# Patient Record
Sex: Female | Born: 1990 | Race: White | Hispanic: No | Marital: Married | Smoking: Never smoker
Health system: Southern US, Community
[De-identification: ages and names within clinical notes are randomized; demographics above are authoritative.]

## PROBLEM LIST (undated history)

## (undated) DIAGNOSIS — R51 Headache: Secondary | ICD-10-CM

## (undated) DIAGNOSIS — R519 Headache, unspecified: Secondary | ICD-10-CM

## (undated) DIAGNOSIS — Z789 Other specified health status: Secondary | ICD-10-CM

## (undated) DIAGNOSIS — Z8619 Personal history of other infectious and parasitic diseases: Secondary | ICD-10-CM

## (undated) DIAGNOSIS — F419 Anxiety disorder, unspecified: Secondary | ICD-10-CM

## (undated) DIAGNOSIS — B999 Unspecified infectious disease: Secondary | ICD-10-CM

## (undated) HISTORY — DX: Other specified health status: Z78.9

## (undated) HISTORY — PX: NO PAST SURGERIES: SHX2092

## (undated) HISTORY — PX: WISDOM TOOTH EXTRACTION: SHX21

## (undated) HISTORY — DX: Personal history of other infectious and parasitic diseases: Z86.19

## (undated) HISTORY — PX: SALPINGECTOMY: SHX328

---
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
## 2012-05-23 LAB — HM PAP SMEAR: HM Pap smear: NEGATIVE

## 2012-06-05 ENCOUNTER — Encounter: Payer: Self-pay | Admitting: Gynecology

## 2012-09-22 ENCOUNTER — Ambulatory Visit: Payer: Self-pay | Admitting: Gynecology

## 2013-12-24 ENCOUNTER — Encounter: Payer: Self-pay | Admitting: Obstetrics and Gynecology

## 2013-12-24 ENCOUNTER — Ambulatory Visit (INDEPENDENT_AMBULATORY_CARE_PROVIDER_SITE_OTHER): Payer: BC Managed Care – PPO | Admitting: Obstetrics and Gynecology

## 2013-12-24 VITALS — BP 104/60 | HR 78 | Ht 62.5 in | Wt 93.0 lb

## 2013-12-24 DIAGNOSIS — Z789 Other specified health status: Secondary | ICD-10-CM

## 2013-12-24 DIAGNOSIS — N63 Unspecified lump in unspecified breast: Secondary | ICD-10-CM

## 2013-12-24 DIAGNOSIS — Z01419 Encounter for gynecological examination (general) (routine) without abnormal findings: Secondary | ICD-10-CM

## 2013-12-24 DIAGNOSIS — N6320 Unspecified lump in the left breast, unspecified quadrant: Secondary | ICD-10-CM

## 2013-12-24 DIAGNOSIS — Z0000 Encounter for general adult medical examination without abnormal findings: Secondary | ICD-10-CM

## 2013-12-24 LAB — POCT URINALYSIS DIPSTICK
BILIRUBIN UA: NEGATIVE
GLUCOSE UA: NEGATIVE
KETONES UA: NEGATIVE
Leukocytes, UA: NEGATIVE
Nitrite, UA: NEGATIVE
Protein, UA: NEGATIVE
RBC UA: NEGATIVE
Urobilinogen, UA: NEGATIVE
pH, UA: 7

## 2013-12-24 LAB — HEMOGLOBIN, FINGERSTICK: Hemoglobin, fingerstick: 14.5 g/dL (ref 12.0–16.0)

## 2013-12-24 NOTE — Progress Notes (Addendum)
GYNECOLOGY VISIT  PCP:   Referring provider:   HPI: 23 y.o.   Married  Caucasian  female   No obstetric history on file. with Kara Landry's last menstrual period was 12/02/2013.   here for   Annual  History of left breast mass and ultrasound in 2011 in [place].  States they had difficulty finding it and that she never had follow up.   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   Wants B12 level checked.  Is a vegetarian off an on.   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  Took combined OCPs in the past and had nausea so she stopped it.  Hgb:  14.5 Urine:  Neg, ph:7.0  GYNECOLOGIC HISTORY: Kara Landry's last menstrual period was 12/02/2013. Sexually active: yes  Partner preference:female  Contraception:   condoms Menopausal hormone therapy:  none DES exposure:   none Blood transfusions: none   Sexually transmitted diseases: none    GYN procedures and prior surgeries:none   Last mammogram:  never               Last pap and high risk HPV testing: 05/23/12 Neg  Addendum - No HR HPV performed in 2014.  History of abnormal pap smear:  no   OB History   Grav Para Term Preterm Abortions TAB SAB Ect Mult Living                   LIFESTYLE: Exercise:  Walking and yoga              OTHER HEALTH MAINTENANCE: Tetanus/TDap:2008 per pt HPV: no, more info on injection Influenza:  no   Bone density: never Colonoscopy:never  Cholesterol check: never  Family History  Problem Relation Age of Onset  . Depression Other   . Osteoporosis Other   . Depression Paternal Aunt   . Heart disease Other     There are no active problems to display for this Kara Landry.  Past Medical History  Diagnosis Date  . Medical history non-contributory     Past Surgical History  Procedure Laterality Date  . No past surgeries      ALLERGIES: Review of Kara Landry's allergies indicates no known allergies.  Current Outpatient Prescriptions  Medication Sig Dispense Refill  . Pediatric  Multivit-Minerals-C (RA GUMMY VITAMINS & MINERALS) CHEW Chew by mouth.       No current facility-administered medications for this visit.     ROS:  Pertinent items are noted in HPI.  History   Social History  . Marital Status: Single    Spouse Name: N/A    Number of Children: N/A  . Years of Education: N/A   Occupational History  . Not on file.   Social History Main Topics  . Smoking status: Never Smoker   . Smokeless tobacco: Not on file  . Alcohol Use: No  . Drug Use: No  . Sexual Activity: Yes    Birth Control/ Protection: Condom   Other Topics Concern  . Not on file   Social History Narrative  . No narrative on file    PHYSICAL EXAMINATION:    BP 104/60  Pulse 78  Ht 5' 2.5" (1.588 m)  Wt 93 lb (42.185 kg)  BMI 16.73 kg/m2  LMP 12/02/2013   Wt Readings from Last 3 Encounters:  12/24/13 93 lb (42.185 kg)     Ht Readings from Last 3 Encounters:  12/24/13 5' 2.5" (1.588 m)    General appearance: alert, cooperative  and appears stated age Head: Normocephalic, without obvious abnormality, atraumatic Neck: no adenopathy, supple, symmetrical, trachea midline and thyroid not enlarged, symmetric, no tenderness/mass/nodules Lungs: clear to auscultation bilaterally Breasts: Inspection negative, No nipple retraction or dimpling, No nipple discharge or bleeding, No axillary or supraclavicular adenopathy, Normal to palpation without dominant masses on right.  Left breast with 1 cm mass in left lateral breast, nontender. Heart: regular rate and rhythm Abdomen: soft, non-tender; no masses,  no organomegaly Extremities: extremities normal, atraumatic, no cyanosis or edema Skin: Skin color, texture, turgor normal. No rashes or lesions Lymph nodes: Cervical, supraclavicular, and axillary nodes normal. No abnormal inguinal nodes palpated Neurologic: Grossly normal  Pelvic: External genitalia:  no lesions              Urethra:  normal appearing urethra with no masses,  tenderness or lesions              Bartholins and Skenes: normal                 Vagina: normal appearing vagina with normal color and discharge, no lesions              Cervix: normal appearance              Pap and high risk HPV testing done: No.        Bimanual Exam:  Uterus:  uterus is normal size, shape, consistency and nontender                                      Adnexa: normal adnexa in size, nontender and no masses                                       ASSESSMENT  Normal gynecologic exam. Left breast mass.  Vegetarian.   PLAN  Left breast ultrasound at [facility]. Mammogram recommended yearly starting at age 44. Pap smear and high risk HPV testing as above. Counseled on self breast exam, Calcium and vitamin D intake, exercise. See lab orders: Yes.   Vit B12 level.  Discussed ParaGard IUD as a long acting nonhormonal contraceptive option.  Written information to Kara Landry who will consider.  Return annually or prn   An After Visit Summary was printed and given to the Kara Landry.

## 2013-12-24 NOTE — Patient Instructions (Signed)
Intrauterine Device Information An intrauterine device (IUD) is inserted into your uterus to prevent pregnancy. There are two types of IUDs available:   Copper IUD--This type of IUD is wrapped in copper wire and is placed inside the uterus. Copper makes the uterus and fallopian tubes produce a fluid that kills sperm. The copper IUD can stay in place for 10 years.  Hormone IUD--This type of IUD contains the hormone progestin (synthetic progesterone). The hormone thickens the cervical mucus and prevents sperm from entering the uterus. It also thins the uterine lining to prevent implantation of a fertilized egg. The hormone can weaken or kill the sperm that get into the uterus. One type of hormone IUD can stay in place for 5 years, and another type can stay in place for 3 years. Your health care provider will make sure you are a good candidate for a contraceptive IUD. Discuss with your health care provider the possible side effects.  ADVANTAGES OF AN INTRAUTERINE DEVICE  IUDs are highly effective, reversible, long acting, and low maintenance.   There are no estrogen-related side effects.   An IUD can be used when breastfeeding.   IUDs are not associated with weight gain.   The copper IUD works immediately after insertion.   The hormone IUD works right away if inserted within 7 days of your period starting. You will need to use a backup method of birth control for 7 days if the hormone IUD is inserted at any other time in your cycle.  The copper IUD does not interfere with your female hormones.   The hormone IUD can make heavy menstrual periods lighter and decrease cramping.   The hormone IUD can be used for 3 or 5 years.   The copper IUD can be used for 10 years. DISADVANTAGES OF AN INTRAUTERINE DEVICE  The hormone IUD can be associated with irregular bleeding patterns.   The copper IUD can make your menstrual flow heavier and more painful.   You may experience cramping and  vaginal bleeding after insertion.  Document Released: 02/21/2004 Document Revised: 11/19/2012 Document Reviewed: 09/07/2012 ExitCare Patient Information 2015 [employer]. This information is not intended to replace advice given to you by your health care provider. Make sure you discuss any questions you have with your health care provider.  

## 2013-12-24 NOTE — Progress Notes (Signed)
Patient is scheduled for L Breast Ultrasound at [facility] for 01/14/14 at 1300 (patient choice of date). Records requested from [facility] to [facility] [place]. Patient signed release.

## 2013-12-25 LAB — VITAMIN B12: VITAMIN B 12: 643 pg/mL (ref 211–911)

## 2014-01-14 ENCOUNTER — Ambulatory Visit
Admission: RE | Admit: 2014-01-14 | Discharge: 2014-01-14 | Disposition: A | Discharge status: Home / Self Care | Payer: BC Managed Care – PPO | Source: Ambulatory Visit | Attending: Obstetrics and Gynecology | Admitting: Obstetrics and Gynecology

## 2014-01-14 DIAGNOSIS — N6320 Unspecified lump in the left breast, unspecified quadrant: Secondary | ICD-10-CM

## 2014-06-21 ENCOUNTER — Other Ambulatory Visit: Payer: Self-pay | Admitting: Obstetrics and Gynecology

## 2014-06-21 DIAGNOSIS — N6320 Unspecified lump in the left breast, unspecified quadrant: Secondary | ICD-10-CM

## 2014-07-16 ENCOUNTER — Ambulatory Visit
Admission: RE | Admit: 2014-07-16 | Discharge: 2014-07-16 | Disposition: A | Discharge status: Home / Self Care | Payer: Self-pay | Source: Ambulatory Visit | Attending: Obstetrics and Gynecology | Admitting: Obstetrics and Gynecology

## 2014-07-16 DIAGNOSIS — N6320 Unspecified lump in the left breast, unspecified quadrant: Secondary | ICD-10-CM

## 2014-10-08 ENCOUNTER — Telehealth: Payer: Self-pay | Admitting: Obstetrics and Gynecology

## 2014-10-08 NOTE — Telephone Encounter (Signed)
Patient says she is having some problems she would like to speak with nurse about.

## 2014-10-08 NOTE — Telephone Encounter (Signed)
Spoke with patient. Patient states that she has been seeing a counselor for depression. Counselor feels symptoms may be related to patients menses. States she has symptoms of PMDD. Patient is requesting an appointment for the week of 7/18 as she will be out of town next week. Appointment scheduled for 10/18/2014 at 2:30pm with Dr.Silva. Patient is agreeable to date and time.  Routing to provider for final review. Patient agreeable to disposition. Will close encounter.   Patient aware provider will review message and nurse will return call if any additional advice or change of disposition.

## 2014-10-18 ENCOUNTER — Ambulatory Visit: Payer: Self-pay | Admitting: Obstetrics and Gynecology

## 2014-11-03 ENCOUNTER — Encounter: Payer: Self-pay | Admitting: Obstetrics and Gynecology

## 2014-11-03 ENCOUNTER — Ambulatory Visit (INDEPENDENT_AMBULATORY_CARE_PROVIDER_SITE_OTHER): Payer: 59 | Admitting: Obstetrics and Gynecology

## 2014-11-03 VITALS — BP 120/76 | HR 100 | Ht 62.5 in | Wt 93.2 lb

## 2014-11-03 DIAGNOSIS — N943 Premenstrual tension syndrome: Secondary | ICD-10-CM

## 2014-11-03 DIAGNOSIS — F3281 Premenstrual dysphoric disorder: Secondary | ICD-10-CM

## 2014-11-03 NOTE — Progress Notes (Signed)
Patient ID: Kara Landry, female   DOB: [birth year], 24 y.o.   MRN: [id number] GYNECOLOGY  VISIT   HPI: 24 y.o.   Married  Caucasian  female   No obstetric history on file. with Patient's last menstrual period was 10/31/2014 (exact date).   here for consultation for depression/anxiety the last 2 weeks of the month.  She states she has been seeing a Social worker and she feels its cycle related and would like Dr. Elza Rafter recommendations.  Anxiety is getting under control.   Has underlying depression. Depression is increased and severe to the point of suicidal thoughts starting 2 weeks prior to her menses.  Lasts until 1 - 2 days after her cycle starts.  Goes to the [place], who is aware of patient's depression and suggested a visit to GYN office. She has phone numbers for psychiatrists to discuss medical therapy but has not made an appointment yet. Wanted to come here to discuss PMDD first.  Considering an IUD to help PMDD.  Menses are normal every 28 days.  Menses are painful and has cramping prior to cycles starting.  Flow is heavy first day or two but no excessive bleeding.   Does not tolerate combined OCPs well so does not take them. Nausea.  Has phobia of vomiting.   Is due for left breast ultrasound in October 2016.   Did exams for teaching.  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   GYNECOLOGIC HISTORY: Patient's last menstrual period was 10/31/2014 (exact date). Contraception:condoms Menopausal hormone therapy: n/a Last mammogram: Left Br.US done 01-14-14 for palpable mass--Oval circumscribed hypoechoic mass within the left breast mayrepresent a small fibroadenoma versus complicated cyst, probably benign. Follow-up ultrasound is recommended at 6, 12,and 24 months to assess stability. The patient concurs with this plan. Left Br. [place] 07-16-14 showing slightly enlarging likely benign mass in the upper-outer left breast.  Probably represents a  fibroadenoma.  Options of six-month followup and tissue sampling were discussed.  The patient desired six-month followup.  Last pap smear: 05-23-12 Neg:No HR HPV testing done        OB History    No data available         Patient Active Problem List   Diagnosis Date Noted  . Left breast mass 12/24/2013    Past Medical History  Diagnosis Date  . Medical history non-contributory     Past Surgical History  Procedure Laterality Date  . No past surgeries      Current Outpatient Prescriptions  Medication Sig Dispense Refill  . Pediatric Multivit-Minerals-C (RA GUMMY VITAMINS & MINERALS) CHEW Chew by mouth.     No current facility-administered medications for this visit.     ALLERGIES: Review of patient's allergies indicates no known allergies.  Family History  Problem Relation Age of Onset  . Depression Other   . Osteoporosis Other   . Breast cancer Other     paternal great aunt - breast cancer  . Depression Paternal Aunt   . Cancer Paternal Aunt 4    paternal aunt - uterine cancer  . Heart disease Other   . Cancer Maternal Aunt 78    maternal aunt - ovarian cancer    History   Social History  . Marital Status: Married    Spouse Name: N/A  . Number of Children: N/A  . Years of Education: N/A   Occupational History  . Not on file.   Social History Main Topics  . Smoking status:  Never Smoker   . Smokeless tobacco: Not on file  . Alcohol Use: No  . Drug Use: No  . Sexual Activity: Yes    Birth Control/ Protection: Condom   Other Topics Concern  . Not on file   Social History Narrative    ROS:  Pertinent items are noted in HPI.  PHYSICAL EXAMINATION:    BP 120/76 mmHg  Pulse 100  Ht 5' 2.5" (1.588 m)  Wt 93 lb 3.2 oz (42.275 kg)  BMI 16.76 kg/m2  LMP 10/31/2014 (Exact Date)    General appearance: alert, cooperative and appears stated age   ASSESSMENT  Depression and PMDD. Hx suicidal thoughts.  Phobia of vomiting.  Intolerance to combined  oral contraceptives - nausea.   PLAN  Counseled regarding PMDD.   I discuss Skyla IUD - risks and benefits.  I am recommending that patient pursue consultation with psychiatry office for medical therapy for depression and PMDD.  Isla Pence can be an adjunct to to therapy, but will not be adequate alone for her care.  Return for annual exam in 2 months.  Call for any assistance needed.  An After Visit Summary was printed and given to the patient.  ____25__ minutes face to face time of which over 50% was spent in counseling.

## 2014-12-30 ENCOUNTER — Ambulatory Visit: Payer: Self-pay | Admitting: Certified Nurse Midwife

## 2015-01-07 ENCOUNTER — Ambulatory Visit: Payer: Self-pay | Admitting: Obstetrics and Gynecology

## 2015-01-18 ENCOUNTER — Other Ambulatory Visit: Payer: Self-pay | Admitting: Obstetrics and Gynecology

## 2015-01-18 DIAGNOSIS — N6320 Unspecified lump in the left breast, unspecified quadrant: Secondary | ICD-10-CM

## 2015-01-25 ENCOUNTER — Ambulatory Visit
Admission: RE | Admit: 2015-01-25 | Discharge: 2015-01-25 | Disposition: A | Discharge status: Home / Self Care | Payer: 59 | Source: Ambulatory Visit | Attending: Obstetrics and Gynecology | Admitting: Obstetrics and Gynecology

## 2015-01-25 DIAGNOSIS — N6320 Unspecified lump in the left breast, unspecified quadrant: Secondary | ICD-10-CM

## 2015-03-11 ENCOUNTER — Telehealth: Payer: Self-pay | Admitting: Obstetrics and Gynecology

## 2015-03-11 NOTE — Telephone Encounter (Signed)
LMTCB about canceled appointment with Dr. Nelson Chimes.

## 2015-03-16 ENCOUNTER — Ambulatory Visit: Payer: Self-pay | Admitting: Obstetrics and Gynecology

## 2015-03-17 ENCOUNTER — Encounter: Payer: Self-pay | Admitting: Obstetrics and Gynecology

## 2015-03-17 ENCOUNTER — Ambulatory Visit (INDEPENDENT_AMBULATORY_CARE_PROVIDER_SITE_OTHER): Payer: 59 | Admitting: Obstetrics and Gynecology

## 2015-03-17 VITALS — BP 106/56 | HR 110 | Resp 16 | Ht 62.5 in | Wt 94.0 lb

## 2015-03-17 DIAGNOSIS — R636 Underweight: Secondary | ICD-10-CM | POA: Diagnosis not present

## 2015-03-17 DIAGNOSIS — Z0000 Encounter for general adult medical examination without abnormal findings: Secondary | ICD-10-CM | POA: Diagnosis not present

## 2015-03-17 DIAGNOSIS — Z01419 Encounter for gynecological examination (general) (routine) without abnormal findings: Secondary | ICD-10-CM

## 2015-03-17 LAB — COMPREHENSIVE METABOLIC PANEL
ALK PHOS: 40 U/L (ref 33–115)
ALT: 14 U/L (ref 6–29)
AST: 16 U/L (ref 10–30)
Albumin: 4.7 g/dL (ref 3.6–5.1)
BILIRUBIN TOTAL: 0.5 mg/dL (ref 0.2–1.2)
BUN: 11 mg/dL (ref 7–25)
CO2: 28 mmol/L (ref 20–31)
CREATININE: 0.68 mg/dL (ref 0.50–1.10)
Calcium: 9.1 mg/dL (ref 8.6–10.2)
Chloride: 104 mmol/L (ref 98–110)
Glucose, Bld: 83 mg/dL (ref 65–99)
Potassium: 4.8 mmol/L (ref 3.5–5.3)
SODIUM: 141 mmol/L (ref 135–146)
TOTAL PROTEIN: 7.3 g/dL (ref 6.1–8.1)

## 2015-03-17 LAB — CBC
HCT: 40.7 % (ref 36.0–46.0)
Hemoglobin: 13.8 g/dL (ref 12.0–15.0)
MCH: 30.7 pg (ref 26.0–34.0)
MCHC: 33.9 g/dL (ref 30.0–36.0)
MCV: 90.6 fL (ref 78.0–100.0)
MPV: 9.9 fL (ref 8.6–12.4)
PLATELETS: 329 10*3/uL (ref 150–400)
RBC: 4.49 MIL/uL (ref 3.87–5.11)
RDW: 12.6 % (ref 11.5–15.5)
WBC: 9.2 10*3/uL (ref 4.0–10.5)

## 2015-03-17 LAB — TSH: TSH: 1.34 u[IU]/mL (ref 0.350–4.500)

## 2015-03-17 NOTE — Progress Notes (Signed)
Patient ID: Kara Landry, female   DOB: 1991/01/20, 24 y.o.   MRN: [id number] 24 y.o. No obstetric history on file. MarriedCaucasianF here for annual exam.  The patient has a stable left breast fibroadenoma. Due for a f/u u/s in 01/2016. The patient has a h/o PMDD, she is working on it, she is meeting with a Psychiatrist to discuss medication. She has issues with anxiety, can effect her ability to eat. Not trying to loose weight. She has been trying to eat more.  Period Cycle (Days): 28 Period Duration (Days): 4-5 days  Period Pattern: Regular Menstrual Flow: Moderate Menstrual Control: Maxi pad, Thin pad Dysmenorrhea Symptoms: Cramping  Only needs 2 pads a day.   Patient's last menstrual period was 02/20/2015.          Sexually active: Yes.    The current method of family planning is condoms all the time.    Exercising: Yes.    walking and yoga Smoker:  no  Health Maintenance: Pap:  05-23-12 WNL  History of abnormal Pap:  no MMG:  Left Breast U/S Stable probable fibroadenoma in the upper outer left breast. Twelve month followup is recommended to ensure stability. Colonoscopy:  Never BMD:   Never TDaP:  07/2006 Gardasil: no    reports that she has never smoked. She has never used smokeless tobacco. 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 Husband is a Insurance underwriter.   Past Medical History  Diagnosis Date  . Medical history non-contributory     Past Surgical History  Procedure Laterality Date  . No past surgeries      Current Outpatient Prescriptions  Medication Sig Dispense Refill  . Multiple Vitamin (MULTIVITAMIN) tablet Take 1 tablet by mouth daily.     No current facility-administered medications for this visit.    Family History  Problem Relation Age of Onset  . Depression Other   . Osteoporosis Other   . Breast cancer Other     paternal great aunt - breast cancer  . Depression  Paternal Aunt   . Cancer Paternal Aunt 93    paternal aunt - uterine cancer  . Heart disease Other   . Cancer Maternal Aunt 5    maternal aunt - ovarian cancer  . Osteoporosis Mother     Review of Systems  Constitutional: Negative.   HENT: Negative.   Eyes: Negative.   Respiratory: Negative.   Cardiovascular: Negative.   Gastrointestinal: Negative.   Endocrine: Negative.   Genitourinary: Negative.   Musculoskeletal: Negative.   Skin: Negative.   Allergic/Immunologic: Negative.   Neurological: Negative.   Psychiatric/Behavioral: Negative.     Exam:   BP 106/56 mmHg  Pulse 110  Resp 16  Ht 5' 2.5" (1.588 m)  Wt 94 lb (42.638 kg)  BMI 16.91 kg/m2  LMP 02/20/2015  Weight change: @WEIGHTCHANGE @ Height:   Height: 5' 2.5" (158.8 cm)  Ht Readings from Last 3 Encounters:  03/17/15 5' 2.5" (1.588 m)  11/03/14 5' 2.5" (1.588 m)  12/24/13 5' 2.5" (1.588 m)    General appearance: alert, cooperative and appears stated age Head: Normocephalic, without obvious abnormality, atraumatic Neck: no adenopathy, supple, symmetrical, trachea midline and thyroid normal to inspection and palpation Lungs: clear to auscultation bilaterally Breasts: left breast with an approximately 1 cm, irregularly shaped, mobile mass at 2 o'clock. No lumps in the right breast. No dimpling or skin changes Heart: regular rate and rhythm  Abdomen: soft, non-tender; bowel sounds normal; no masses,  no organomegaly Extremities: extremities normal, atraumatic, no cyanosis or edema Skin: Skin color, texture, turgor normal. No rashes or lesions Lymph nodes: Cervical, supraclavicular, and axillary nodes normal. No abnormal inguinal nodes palpated Neurologic: Grossly normal   Pelvic: External genitalia:  no lesions              Urethra:  normal appearing urethra with no masses, tenderness or lesions              Bartholins and Skenes: normal                 Vagina: normal appearing vagina with normal color and  discharge, no lesions              Cervix: no lesions               Bimanual Exam:  Uterus:  normal size, contour, position, consistency, mobility, non-tender              Adnexa: no mass, fullness, tenderness               Rectovaginal: Confirms               Anus:  normal sphincter tone, no lesions  Chaperone was present for exam.  A:  Well Woman with normal exam  PMDD  Anxiety  Underweight (not intentional)  Left breast fibroadenoma, stable  P:   No pap this year  She is seeing a Teacher, music and plans to start medication for PMDD and anxiety  TSH, CBC, CMP  No pap this year  She will continue with condoms and NFP for contraception  Continue monthly breast self exams  F/U breast ultrasound in 10/17

## 2015-03-17 NOTE — Patient Instructions (Signed)
EXERCISE AND DIET:  We recommended that you start or continue a regular exercise program for good health. Regular exercise means any activity that makes your heart beat faster and makes you sweat.  We recommend exercising at least 30 minutes per day at least 3 days a week, preferably 4 or 5.  We also recommend a diet low in fat and sugar.  Inactivity, poor dietary choices and obesity can cause diabetes, heart attack, stroke, and kidney damage, among others.    ALCOHOL AND SMOKING:  Women should limit their alcohol intake to no more than 7 drinks/beers/glasses of wine (combined, not each!) per week. Moderation of alcohol intake to this level decreases your risk of breast cancer and liver damage. And of course, no recreational drugs are part of a healthy lifestyle.  And absolutely no smoking or even second hand smoke. Most people know smoking can cause heart and lung diseases, but did you know it also contributes to weakening of your bones? Aging of your skin?  Yellowing of your teeth and nails?  CALCIUM AND VITAMIN D:  Adequate intake of calcium and Vitamin D are recommended.  The recommendations for exact amounts of these supplements seem to change often, but generally speaking 600 mg of calcium (either carbonate or citrate) and 800 units of Vitamin D per day seems prudent. Certain women may benefit from higher intake of Vitamin D.  If you are among these women, your doctor will have told you during your visit.    PAP SMEARS:  Pap smears, to check for cervical cancer or precancers,  have traditionally been done yearly, although recent scientific advances have shown that most women can have pap smears less often.  However, every woman still should have a physical exam from her gynecologist every year. It will include a breast check, inspection of the vulva and vagina to check for abnormal growths or skin changes, a visual exam of the cervix, and then an exam to evaluate the size and shape of the uterus and  ovaries.  And after 26 years of age, a rectal exam is indicated to check for rectal cancers. We will also provide age appropriate advice regarding health maintenance, like when you should have certain vaccines, screening for sexually transmitted diseases, bone density testing, colonoscopy, mammograms, etc.   MAMMOGRAMS:  All women over 40 years old should have a yearly mammogram. Many facilities now offer a "3D" mammogram, which may cost around $50 extra out of pocket. If possible,  we recommend you accept the option to have the 3D mammogram performed.  It both reduces the number of women who will be called back for extra views which then turn out to be normal, and it is better than the routine mammogram at detecting truly abnormal areas.    COLONOSCOPY:  Colonoscopy to screen for colon cancer is recommended for all women at age 50.  We know, you hate the idea of the prep.  We agree, BUT, having colon cancer and not knowing it is worse!!  Colon cancer so often starts as a polyp that can be seen and removed at colonscopy, which can quite literally save your life!  And if your first colonoscopy is normal and you have no family history of colon cancer, most women don't have to have it again for 10 years.  Once every ten years, you can do something that may end up saving your life, right?  We will be happy to help you get it scheduled when you are ready.    Be sure to check your insurance coverage so you understand how much it will cost.  It may be covered as a preventative service at no cost, but you should check your particular policy.     Premenstrual Syndrome Premenstrual syndrome (PMS) is a condition that consists of physical, emotional, and behavioral symptoms that affect women of childbearing age. PMS occurs 5-14 days before the start of a menstrual period and often recurs in a predictable pattern. The symptoms go away a few days after the menstrual period starts. PMS can interfere in many ways with normal  daily activities and can range from mild to severe. When PMS is considered severe, it may be diagnosed as premenstrual dysphoric disorder (PMDD). A small percentage of women are affected by PMS symptoms and an even smaller percentage of those women are affected by PMDD.  CAUSES  The exact cause of PMS is unknown, but it seems to be related to cyclic hormone changes that happen before menstruation. These hormones are thought to affect chemicals in the brain (serotonin) that can influence a person's mood.  SYMPTOMS  Symptoms of PMS recur consistently from month to month and go away completely after the menstrual period starts. The most common emotional or behavioral symptom is mood swings. These mood swings can be disabling and interfere with normal activities of daily living. Other common symptoms include depression and angry outbursts. Other symptoms may include:   Irritability.  Anxiety.  Crying spells.   Food cravings or appetite changes.   Changes in sexual desire.   Confusion.   Aggression.   Social withdrawal.   Poor concentration. The most common physical symptoms include a sense of bloating, breast pain, headaches, and extreme fatigue. Other physical symptoms include:   Backaches.   Swelling of the hands and feet.   Weight gain.   Hot flashes.  DIAGNOSIS  To make a diagnosis, your caregiver will ask questions to confirm that you are having a pattern of symptoms. Symptoms must:   Be present 5 days before the start of your period and be present at least 3 months in a row.   End within 4 days after your period starts.   Interfere with some of your normal activities.  Other conditions, such as thyroid disease, depression, and migraine headaches must be ruled out before a diagnosis of PMS is confirmed.  TREATMENT  Your caregiver may suggest ways to maintain a healthy lifestyle, such as exercise. Over-the-counter pain relievers may ease cramps, aches, pains,  headaches, and breast tenderness. However, selective serotonin reuptake inhibitors (SSRIs) are medicines that are most beneficial in improving PMS if taken in the second half of the monthly cycle. They may be taken on a daily basis. The most effective oral contraceptive pill used for symptoms of PMS is one that contains the ingredient drospirenone. Taking 4 days off of the pill instead of the usual 7 days also has shown to increase effectiveness.  There are a number of drugs, dietary supplements, vitamins, and water pills (diuretics) which have been suggested to be helpful but have not shown to be of any benefit to improving PMS symptoms.  HOME CARE INSTRUCTIONS   For 2-3 months, write down your symptoms, their severity, and how long they last. This may help your caregiver prescribe the best treatment for your symptoms.  Exercise regularly as suggested by your caregiver.  Eat a regular, well-balanced diet.  Avoid caffeine, alcohol, and tobacco consumption.  Limit salt and salty foods to lessen bloating and fluid  retention.  Get enough sleep. Practice relaxation techniques.  Drink enough fluids to keep your urine clear or pale yellow.  Take medicines as directed by your caregiver.  Limit stress.  Take a multivitamin as directed by your caregiver.   This information is not intended to replace advice given to you by your health care provider. Make sure you discuss any questions you have with your health care provider.   Document Released: 03/16/2000 Document Revised: 12/12/2011 Document Reviewed: 08/06/2011 Elsevier Interactive Patient Education [employer].

## 2015-07-05 ENCOUNTER — Encounter (HOSPITAL_COMMUNITY): Payer: Self-pay

## 2015-07-05 ENCOUNTER — Emergency Department (HOSPITAL_COMMUNITY): Payer: 59

## 2015-07-05 ENCOUNTER — Emergency Department (HOSPITAL_COMMUNITY)
Admission: EM | Admit: 2015-07-05 | Discharge: 2015-07-05 | Disposition: A | Discharge status: Home / Self Care | Payer: 59 | Attending: Emergency Medicine | Admitting: Emergency Medicine

## 2015-07-05 DIAGNOSIS — R000 Tachycardia, unspecified: Secondary | ICD-10-CM | POA: Diagnosis not present

## 2015-07-05 DIAGNOSIS — Z79899 Other long term (current) drug therapy: Secondary | ICD-10-CM | POA: Diagnosis not present

## 2015-07-05 DIAGNOSIS — Y92410 Unspecified street and highway as the place of occurrence of the external cause: Secondary | ICD-10-CM | POA: Diagnosis not present

## 2015-07-05 DIAGNOSIS — F419 Anxiety disorder, unspecified: Secondary | ICD-10-CM | POA: Insufficient documentation

## 2015-07-05 DIAGNOSIS — Y998 Other external cause status: Secondary | ICD-10-CM | POA: Insufficient documentation

## 2015-07-05 DIAGNOSIS — R0789 Other chest pain: Secondary | ICD-10-CM

## 2015-07-05 DIAGNOSIS — S29001A Unspecified injury of muscle and tendon of front wall of thorax, initial encounter: Secondary | ICD-10-CM | POA: Diagnosis not present

## 2015-07-05 DIAGNOSIS — Y9389 Activity, other specified: Secondary | ICD-10-CM | POA: Diagnosis not present

## 2015-07-05 DIAGNOSIS — S30811A Abrasion of abdominal wall, initial encounter: Secondary | ICD-10-CM | POA: Diagnosis not present

## 2015-07-05 DIAGNOSIS — S299XXA Unspecified injury of thorax, initial encounter: Secondary | ICD-10-CM | POA: Diagnosis present

## 2015-07-05 HISTORY — DX: Anxiety disorder, unspecified: F41.9

## 2015-07-05 MED ORDER — DIAZEPAM 2 MG PO TABS: 2.0000 mg | ORAL_TABLET | Freq: Three times a day (TID) | ORAL | Status: DC

## 2015-07-05 MED ORDER — IBUPROFEN 800 MG PO TABS: 800.0000 mg | ORAL_TABLET | Freq: Three times a day (TID) | ORAL | Status: AC

## 2015-07-05 MED ORDER — KETOROLAC TROMETHAMINE 30 MG/ML IJ SOLN
30.0000 mg | Freq: Once | INTRAMUSCULAR | Status: AC
  Administered 2015-07-05: 30 mg via INTRAMUSCULAR
  Filled 2015-07-05: qty 1

## 2015-07-05 NOTE — ED Notes (Signed)
Patient transported to X-ray 
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
## 2015-07-08 ENCOUNTER — Encounter: Payer: Self-pay | Admitting: Physician Assistant

## 2015-07-08 ENCOUNTER — Ambulatory Visit (INDEPENDENT_AMBULATORY_CARE_PROVIDER_SITE_OTHER): Payer: 59 | Admitting: Physician Assistant

## 2015-07-08 ENCOUNTER — Ambulatory Visit (HOSPITAL_BASED_OUTPATIENT_CLINIC_OR_DEPARTMENT_OTHER)
Admission: RE | Admit: 2015-07-08 | Discharge: 2015-07-08 | Disposition: A | Discharge status: Home / Self Care | Payer: 59 | Source: Ambulatory Visit | Attending: Physician Assistant | Admitting: Physician Assistant

## 2015-07-08 DIAGNOSIS — F411 Generalized anxiety disorder: Secondary | ICD-10-CM | POA: Diagnosis not present

## 2015-07-08 DIAGNOSIS — M542 Cervicalgia: Secondary | ICD-10-CM

## 2015-07-08 MED ORDER — HYDROCODONE-ACETAMINOPHEN 5-325 MG PO TABS: 1.0000 | ORAL_TABLET | Freq: Four times a day (QID) | ORAL | Status: DC | PRN
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
## 2015-07-08 NOTE — Assessment & Plan Note (Signed)
Followed by Psychiatry. Doing very well on BuSpar. Will continue current regimen. FU with specialist as scheduled.

## 2015-07-08 NOTE — Patient Instructions (Signed)
Please go downstairs for x-ray. I will call you with your results. Please use the Norco as directed for severe pain. Continue wearing collar until I tell you otherwise.  Use the Valium as directed. No driving while on this medication. Continue warm compresses. Add on a topical Aspercreme.  Follow-up with me next week (Tuesday or Wednesday) If I see anything worrisome on x-ray I will be sending you to see a specialist.  Welcome to [facility]!

## 2015-07-08 NOTE — Progress Notes (Signed)
Pre visit review using our clinic review tool, if applicable. No additional management support is needed unless otherwise documented below in the visit note. 
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
## 2015-07-13 ENCOUNTER — Ambulatory Visit (INDEPENDENT_AMBULATORY_CARE_PROVIDER_SITE_OTHER): Payer: 59 | Admitting: Physician Assistant

## 2015-07-13 ENCOUNTER — Encounter: Payer: Self-pay | Admitting: Physician Assistant

## 2015-07-13 VITALS — BP 110/66 | HR 110 | Temp 98.0°F | Resp 16 | Ht 62.5 in | Wt 93.4 lb

## 2015-07-13 DIAGNOSIS — M542 Cervicalgia: Secondary | ICD-10-CM | POA: Diagnosis not present

## 2015-07-13 NOTE — Patient Instructions (Signed)
I am glad symptoms are improving. Continue medications and stretches as directed. Stay hydrated and hold a pillow to chest if you need to cough or to help with deep breaths.  You can transition to tylenol or ibuprofen as pain improves.   Follow-up if symptoms are not continuing to resolve.

## 2015-07-15 DIAGNOSIS — M542 Cervicalgia: Secondary | ICD-10-CM | POA: Insufficient documentation
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
## 2015-09-21 ENCOUNTER — Telehealth: Payer: Self-pay | Admitting: *Deleted

## 2015-09-21 NOTE — Telephone Encounter (Signed)
OV notes from 07/08/15 and 07/13/15 with [facility] printed and faxed to [phone] successfully.  Noted on fax cover sheet that records from pt's ED visit at [facility] needs to be faxed to [phone].

## 2015-10-17 ENCOUNTER — Encounter: Payer: Self-pay | Admitting: Physician Assistant

## 2015-10-17 ENCOUNTER — Ambulatory Visit (INDEPENDENT_AMBULATORY_CARE_PROVIDER_SITE_OTHER): Payer: 59 | Admitting: Physician Assistant

## 2015-10-17 VITALS — BP 114/82 | HR 84 | Temp 98.4°F | Resp 16 | Ht 62.5 in | Wt 90.5 lb

## 2015-10-17 DIAGNOSIS — Z111 Encounter for screening for respiratory tuberculosis: Secondary | ICD-10-CM

## 2015-10-17 DIAGNOSIS — Z021 Encounter for pre-employment examination: Secondary | ICD-10-CM | POA: Diagnosis not present

## 2015-10-17 NOTE — Progress Notes (Signed)
Patient presents to clinic today to review forms for new job. 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 Needs to verify she is up-to-date on immunizations, need medical clearance and PPD testing. Denies acute concerns today. Has immunization records with her for review. Denies foreign travel, cough, hemoptysis or history of TB or TB exposure. Denies difficulty with hearing. Wears corrective lenses. Denies history of mental illness. Denies current anxiety/depression. Is excited about new job. Patient denies any prior issue that would limit ability to be physically active.  Past Medical History  Diagnosis Date  . Medical history non-contributory   . Anxiety   . History of chicken pox 1996    Current Outpatient Prescriptions on File Prior to Visit  Medication Sig Dispense Refill  . busPIRone (BUSPAR) 5 MG tablet Take 5 mg by mouth 2 (two) times daily.    Marland Kitchen ibuprofen (ADVIL,MOTRIN) 200 MG tablet Take 400 mg by mouth every 6 (six) hours as needed.    . Multiple Vitamins-Minerals (ADULT GUMMY) CHEW Chew 2 capsules by mouth daily.     No current facility-administered medications on file prior to visit.    No Known Allergies  Family History  Problem Relation Age of Onset  . Depression Other   . Osteoporosis Other   . Breast cancer Other     paternal great aunt - breast cancer  . Depression Paternal Aunt   . Cancer Paternal Aunt 87    paternal aunt - uterine cancer  . Heart disease Other   . Cancer Maternal Aunt 37    maternal aunt - ovarian cancer  . Osteoporosis Mother     Social History   Social History  . Marital Status: Married    Spouse Name: N/A  . Number of Children: 0  . Years of Education: N/A   Occupational History  . Student     elementary education   Social History Main Topics  . Smoking status: Never Smoker   . Smokeless tobacco: Never Used  . Alcohol Use: No  . Drug Use: No  . Sexual Activity:    Partners: Male    Birth  Control/ Protection: Condom   Other Topics Concern  . None   Social History Narrative    Review of Systems - See HPI.  All other ROS are negative.  BP 114/82 mmHg  Pulse 84  Temp(Src) 98.4 F (36.9 C) (Oral)  Resp 16  Ht 5' 2.5" (1.588 m)  Wt 90 lb 8 oz (41.051 kg)  BMI 16.28 kg/m2  SpO2 99%  LMP 10/17/2015  Physical Exam  Constitutional: She is oriented to person, place, and time and well-developed, well-nourished, and in no distress.  HENT:  Head: Normocephalic and atraumatic.  Eyes: Conjunctivae are normal.  Neck: Neck supple. No thyromegaly present.  Cardiovascular: Normal rate, regular rhythm, normal heart sounds and intact distal pulses.   Pulmonary/Chest: Effort normal and breath sounds normal. No respiratory distress. She has no wheezes. She has no rales. She exhibits no tenderness.  Musculoskeletal: Normal range of motion. She exhibits no edema or tenderness.  Neurological: She is alert and oriented to person, place, and time.  Skin: Skin is warm and dry. No rash noted.  Psychiatric: Affect normal.  Vitals reviewed.  Assessment/Plan: 1. Screening for tuberculosis PPD placed by nursing staff. She will return Wednesday for read.  - PPD  2. Pre-employment examination No concerning findings. Immunizations up-to-date. Will abstract into the EMR. Forms have been completed. Will  sign pending PPD test result.   Leeanne Rio, PA-C

## 2015-10-17 NOTE — Patient Instructions (Signed)
Everything looks good today.  Please return between 11:00 Wednesday and 11:00 Thursday to have test read.   I will give you your signed forms once we have the result!  Good luck at your new job.

## 2015-10-17 NOTE — Progress Notes (Signed)
Pre visit review using our clinic review tool, if applicable. No additional management support is needed unless otherwise documented below in the visit note/SLS  

## 2015-10-19 LAB — TB SKIN TEST
INDURATION: 0 mm
TB Skin Test: NEGATIVE

## 2015-10-27 ENCOUNTER — Telehealth: Payer: Self-pay | Admitting: *Deleted

## 2015-10-27 NOTE — Telephone Encounter (Signed)
Patient is retuning call following MyChart request to schedule an appointment.   AEX rescheduled with patient. Closing encounter.

## 2016-02-07 ENCOUNTER — Telehealth: Payer: Self-pay | Admitting: *Deleted

## 2016-02-07 NOTE — Telephone Encounter (Signed)
Patient in 04 recall for left breast U/S. Left message for patient to call regarding scheduling -eh

## 2016-02-10 NOTE — Telephone Encounter (Signed)
Patient returning your call.

## 2016-02-13 NOTE — Telephone Encounter (Signed)
Left patient a message to call back regarding recall-eh

## 2016-02-17 NOTE — Telephone Encounter (Signed)
Patient returning your call.

## 2016-02-20 NOTE — Telephone Encounter (Signed)
Recall extended to 03/2016 -eh

## 2016-02-20 NOTE — Telephone Encounter (Signed)
Please place in recall for December

## 2016-02-20 NOTE — Telephone Encounter (Signed)
Left patient another message -eh

## 2016-02-20 NOTE — Telephone Encounter (Signed)
Spoke with patient and reminded her it was time for her left Breast U/S. 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 She states that she will try to get it done in December. Please advise of recall status- eh

## 2016-02-28 ENCOUNTER — Other Ambulatory Visit: Payer: Self-pay | Admitting: Obstetrics and Gynecology

## 2016-02-28 DIAGNOSIS — N6320 Unspecified lump in the left breast, unspecified quadrant: Secondary | ICD-10-CM

## 2016-03-07 ENCOUNTER — Ambulatory Visit
Admission: RE | Admit: 2016-03-07 | Discharge: 2016-03-07 | Disposition: A | Discharge status: Home / Self Care | Payer: 59 | Source: Ambulatory Visit | Attending: Obstetrics and Gynecology | Admitting: Obstetrics and Gynecology

## 2016-03-07 DIAGNOSIS — N6320 Unspecified lump in the left breast, unspecified quadrant: Secondary | ICD-10-CM

## 2016-04-04 ENCOUNTER — Ambulatory Visit: Payer: 59 | Admitting: Obstetrics and Gynecology

## 2016-07-04 ENCOUNTER — Telehealth: Payer: Self-pay | Admitting: Obstetrics and Gynecology

## 2016-07-04 NOTE — Telephone Encounter (Signed)
Left message regarding appointment tomorrow needing to be rescheduled due to schedule conflict that Dr.Jertson will be in a procedure. Patient can be seen at 11:30 am if okay. I moved patient to 11:30 am, waiting to here back from patient.

## 2016-07-05 ENCOUNTER — Ambulatory Visit: Payer: Self-pay | Admitting: Obstetrics and Gynecology

## 2016-07-05 ENCOUNTER — Ambulatory Visit (INDEPENDENT_AMBULATORY_CARE_PROVIDER_SITE_OTHER): Payer: 59 | Admitting: Obstetrics and Gynecology

## 2016-07-05 ENCOUNTER — Encounter: Payer: Self-pay | Admitting: Obstetrics and Gynecology

## 2016-07-05 VITALS — BP 112/62 | HR 104 | Resp 14 | Ht 62.5 in | Wt 96.0 lb

## 2016-07-05 DIAGNOSIS — Z0000 Encounter for general adult medical examination without abnormal findings: Secondary | ICD-10-CM

## 2016-07-05 DIAGNOSIS — Z789 Other specified health status: Secondary | ICD-10-CM | POA: Diagnosis not present

## 2016-07-05 DIAGNOSIS — Z01419 Encounter for gynecological examination (general) (routine) without abnormal findings: Secondary | ICD-10-CM

## 2016-07-05 DIAGNOSIS — R636 Underweight: Secondary | ICD-10-CM

## 2016-07-05 DIAGNOSIS — Z124 Encounter for screening for malignant neoplasm of cervix: Secondary | ICD-10-CM | POA: Diagnosis not present

## 2016-07-05 LAB — LIPID PANEL
CHOL/HDL RATIO: 2.6 ratio (ref <5.0)
Cholesterol: 133 mg/dL (ref <200)
HDL: 52 mg/dL (ref >50)
LDL CALC: 45 mg/dL (ref <100)
Triglycerides: 182 mg/dL — ABNORMAL HIGH (ref <150)
VLDL: 36 mg/dL — ABNORMAL HIGH (ref <30)

## 2016-07-05 LAB — COMPREHENSIVE METABOLIC PANEL
ALK PHOS: 43 U/L (ref 33–115)
ALT: 21 U/L (ref 6–29)
AST: 21 U/L (ref 10–30)
Albumin: 4.5 g/dL (ref 3.6–5.1)
BUN: 12 mg/dL (ref 7–25)
CO2: 27 mmol/L (ref 20–31)
Calcium: 9.4 mg/dL (ref 8.6–10.2)
Chloride: 107 mmol/L (ref 98–110)
Creat: 0.73 mg/dL (ref 0.50–1.10)
Glucose, Bld: 74 mg/dL (ref 65–99)
POTASSIUM: 4.3 mmol/L (ref 3.5–5.3)
Sodium: 141 mmol/L (ref 135–146)
Total Bilirubin: 0.3 mg/dL (ref 0.2–1.2)
Total Protein: 7 g/dL (ref 6.1–8.1)

## 2016-07-05 LAB — CBC
HCT: 43.6 % (ref 35.0–45.0)
Hemoglobin: 14.2 g/dL (ref 11.7–15.5)
MCH: 29.3 pg (ref 27.0–33.0)
MCHC: 32.6 g/dL (ref 32.0–36.0)
MCV: 90.1 fL (ref 80.0–100.0)
MPV: 9.9 fL (ref 7.5–12.5)
Platelets: 311 10*3/uL (ref 140–400)
RBC: 4.84 MIL/uL (ref 3.80–5.10)
RDW: 13.2 % (ref 11.0–15.0)
WBC: 6.3 10*3/uL (ref 3.8–10.8)

## 2016-07-05 LAB — VITAMIN B12: Vitamin B-12: 519 pg/mL (ref 200–1100)

## 2016-07-05 LAB — TSH: TSH: 1.72 mIU/L

## 2016-07-05 NOTE — Progress Notes (Signed)
26 y.o. G0P0000 MarriedCaucasianF here for annual exam.   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 Last ultrasound in 12/17, no further f/u needed unless there is a change.  She has a h/o PMDD, she has gotten on buspar since her last visit and is doing better. Her anxiety is better, but manageable. She hasn't tried a SSRI. Vomiting causes great anxiety for her, gives her a fear of pregnancy. She is seeing a Administrator to manage her meds.  Sexually active, no pain. Using condoms.  Period Cycle (Days): 28 Period Duration (Days): 4-5 days Period Pattern: Regular Menstrual Flow: Moderate Menstrual Control: Maxi pad Menstrual Control Change Freq (Hours): changes pad every 6-8 hours  Dysmenorrhea: (!) Mild Dysmenorrhea Symptoms: Cramping  Patient's last menstrual period was 06/14/2016.          Sexually active: Yes.    The current method of family planning is condoms sometimes.    Exercising: Yes.    walking/ yoga Smoker:  no  Health Maintenance: Pap:  05-23-12 WNL  History of abnormal Pap:  no MMG: 03-07-16 Left breast U/S WNL  Colonoscopy:  Never BMD:   Never TDaP:  2008 Gardasil: no   reports that she has never smoked. She has never used smokeless tobacco. She reports that she does not drink alcohol or use drugs. She is a [employer].   Past Medical History:  Diagnosis Date  . Anxiety   . History of chicken pox 1996  . Medical history non-contributory     Past Surgical History:  Procedure Laterality Date  . NO PAST SURGERIES      Current Outpatient Prescriptions  Medication Sig Dispense Refill  . busPIRone (BUSPAR) 5 MG tablet Take 5 mg by mouth 2 (two) times daily.    Marland Kitchen ibuprofen (ADVIL,MOTRIN) 200 MG tablet Take 400 mg by mouth every 6 (six) hours as needed.    . Multiple Vitamins-Minerals (ADULT GUMMY) CHEW Chew 2 capsules by mouth daily.     No current facility-administered medications for this visit.      Family History  Problem Relation Age of Onset  . Depression Other   . Osteoporosis Other   . Breast cancer Other     paternal great aunt - breast cancer  . Heart disease Other   . Depression Paternal Aunt   . Cancer Paternal Aunt 30    paternal aunt - uterine cancer  . Cancer Maternal Aunt 5    maternal aunt - ovarian cancer  . Osteoporosis Mother     Review of Systems  Constitutional: Negative.   HENT: Negative.   Eyes: Negative.   Respiratory: Negative.   Cardiovascular: Negative.   Gastrointestinal: Negative.   Endocrine: Negative.   Genitourinary: Negative.   Musculoskeletal: Negative.   Skin: Negative.   Allergic/Immunologic: Negative.   Neurological: Negative.   Psychiatric/Behavioral: Negative.     Exam:   BP 112/62 (BP Location: Right Arm, Patient Position: Sitting, Cuff Size: Normal)   Pulse (!) 104   Resp 14   Ht 5' 2.5" (1.588 m)   Wt 96 lb (43.5 kg)   LMP 06/14/2016   BMI 17.28 kg/m   Weight change: @WEIGHTCHANGE @ Height:   Height: 5' 2.5" (158.8 cm)  Ht Readings from Last 3 Encounters:  07/05/16 5' 2.5" (1.588 m)  10/17/15 5' 2.5" (1.588 m)  07/13/15 5' 2.5" (1.588 m)    General appearance: alert, cooperative and appears stated age Head: Normocephalic, without  obvious abnormality, atraumatic Neck: no adenopathy, supple, symmetrical, trachea midline and thyroid normal to inspection and palpation Lungs: clear to auscultation bilaterally Cardiovascular: regular rate and rhythm Breasts: stable, mildly tender 1 cm lump in the left breast at 2 o'clock, no other lumps on either side, no skin changes Abdomen: soft, non-tender; bowel sounds normal; no masses,  no organomegaly Extremities: extremities normal, atraumatic, no cyanosis or edema Skin: Skin color, texture, turgor normal. No rashes or lesions Lymph nodes: Cervical, supraclavicular, and axillary nodes normal. No abnormal inguinal nodes palpated Neurologic: Grossly normal   Pelvic:  External genitalia:  no lesions              Urethra:  normal appearing urethra with no masses, tenderness or lesions              Bartholins and Skenes: normal                 Vagina: normal appearing vagina with normal color and discharge, no lesions              Cervix: no lesions               Bimanual Exam:  Uterus:  normal size, contour, position, consistency, mobility, non-tender and anteverted              Adnexa: no mass, fullness, tenderness               Rectovaginal: Confirms               Anus:  normal sphincter tone, no lesions  Chaperone was present for exam.  A:  Well Woman with normal exam  Vegetarian, +/- vegan  Underweight, stems from her anxiety  Anxiety, on buspar, symptoms have improved  P:   Pap with hpv  Screening labs  Discussed breast self exam  Discussed calcium and vit D intake  B12 and vit d for possible def  Continue a multivitamin  Continue with therapy and psychiatrist for BV

## 2016-07-05 NOTE — Patient Instructions (Signed)

## 2016-07-06 LAB — VITAMIN D 25 HYDROXY (VIT D DEFICIENCY, FRACTURES): VIT D 25 HYDROXY: 25 ng/mL — AB (ref 30–100)

## 2016-07-09 LAB — IPS PAP TEST WITH HPV

## 2017-04-15 ENCOUNTER — Telehealth: Payer: Self-pay | Admitting: Obstetrics and Gynecology

## 2017-04-15 NOTE — Telephone Encounter (Signed)
Left message to call Kollins Fenter at [phone].  

## 2017-04-15 NOTE — Telephone Encounter (Signed)
Patient returned call

## 2017-04-15 NOTE — Telephone Encounter (Signed)
Patient has a left breast lump and is having pain.

## 2017-04-15 NOTE — Telephone Encounter (Signed)
Left message to call Ebunoluwa Gernert at [phone].  

## 2017-04-15 NOTE — Telephone Encounter (Signed)
Spoke with patient. Reports left breast lump and pain, started 03/11/17. Reports left breast lump has been there, has had [place] in past, reports pain is new. Describes as constant, "strange" pain. Worse when wearing a bra.   Denies any skin changes or nipple d/c.   Recommended OV for further evaluation. Patient states she can only come after 4pm, request 1/21 if available. OV scheduled for 04/22/17 at 12:45 PM with Dr. Talbert Nan.   Routing to provider for final review. Patient is agreeable to disposition. Will close encounter.

## 2017-04-22 ENCOUNTER — Ambulatory Visit (INDEPENDENT_AMBULATORY_CARE_PROVIDER_SITE_OTHER): Payer: 59 | Admitting: Obstetrics and Gynecology

## 2017-04-22 ENCOUNTER — Encounter: Payer: Self-pay | Admitting: Obstetrics and Gynecology

## 2017-04-22 ENCOUNTER — Other Ambulatory Visit: Payer: Self-pay

## 2017-04-22 VITALS — BP 118/70 | HR 76 | Resp 14 | Ht 62.0 in | Wt 96.0 lb

## 2017-04-22 DIAGNOSIS — N644 Mastodynia: Secondary | ICD-10-CM

## 2017-04-22 DIAGNOSIS — N6320 Unspecified lump in the left breast, unspecified quadrant: Secondary | ICD-10-CM | POA: Diagnosis not present

## 2017-04-22 NOTE — Progress Notes (Signed)
Patient scheduled while in office. Spoke with Kara Landry at [facility]. Patient scheduled for left breast US on 04/26/17 arriving at 1:20pm for 1:40pm appointment. Patient verbalizes understanding and is agreeable.

## 2017-04-22 NOTE — Progress Notes (Signed)
GYNECOLOGY  VISIT   HPI: 27 y.o.   Married  Caucasian  female   G0P0000 with Patient's last menstrual period was 04/04/2017.   here for left breast pain/lump. The patient has evaluated for a probable fibroadenoma in her left breast. Her last u/s was in 12/17 showed a 1.8 x 1.4 x 0.7 mass at 2 o'clock, no change over a 2 year period.  She started having pain in the area of this lump on 03/11/17. The pain is an intermittent sharp, zing like pain. The pain is there most of the time. Up to a 4-6/10 in severity. Tender to touch. Bothersome. She doesn't think it is larger.  No change in her caffeine intake. She has one cup of coffee a day.   GYNECOLOGIC HISTORY: Patient's last menstrual period was 04/04/2017. Contraception:none Menopausal hormone therapy: none        OB History    Gravida Para Term Preterm AB Living   0 0 0 0 0 0   SAB TAB Ectopic Multiple Live Births   0 0 0 0 0         Patient Active Problem List   Diagnosis Date Noted  . Neck pain 07/15/2015  . Generalized anxiety disorder 07/08/2015  . Left breast mass 12/24/2013    Past Medical History:  Diagnosis Date  . Anxiety   . History of chicken pox 1996  . Medical history non-contributory     Past Surgical History:  Procedure Laterality Date  . NO PAST SURGERIES      Current Outpatient Medications  Medication Sig Dispense Refill  . ibuprofen (ADVIL,MOTRIN) 200 MG tablet Take 400 mg by mouth every 6 (six) hours as needed.    . Multiple Vitamins-Minerals (ADULT GUMMY) CHEW Chew 2 capsules by mouth daily.     No current facility-administered medications for this visit.      ALLERGIES: Patient has no known allergies.  Family History  Problem Relation Age of Onset  . Depression Other   . Osteoporosis Other   . Breast cancer Other        paternal great aunt - breast cancer  . Heart disease Other   . Depression Paternal Aunt   . Cancer Paternal Aunt 76       paternal aunt - uterine cancer  . Cancer  Maternal Aunt 32       maternal aunt - ovarian cancer  . Osteoporosis Mother     Social History   Socioeconomic History  . Marital status: Married    Spouse name: Not on file  . Number of children: 0  . Years of education: Not on file  . Highest education level: Not on file  Social Needs  . Financial resource strain: Not on file  . Food insecurity - worry: Not on file  . Food insecurity - inability: Not on file  . Transportation needs - medical: Not on file  . Transportation needs - non-medical: Not on file  Occupational History  . Occupation: Ship broker    Comment: elementary education  Tobacco Use  . Smoking status: Never Smoker  . Smokeless tobacco: Never Used  Substance and Sexual Activity  . Alcohol use: No    Alcohol/week: 0.0 oz  . Drug use: No  . Sexual activity: Yes    Partners: Male    Birth control/protection: Condom  Other Topics Concern  . Not on file  Social History Narrative  . Not on file    Review of Systems  Constitutional:  Breast pain Breast lumps   HENT: Negative.   Eyes: Negative.   Respiratory: Negative.   Cardiovascular: Negative.   Gastrointestinal: Negative.   Genitourinary: Negative.   Musculoskeletal: Negative.   Skin: Negative.   Neurological: Negative.   Endo/Heme/Allergies: Negative.   Psychiatric/Behavioral: Negative.     PHYSICAL EXAMINATION:    BP 118/70 (BP Location: Right Arm, Patient Position: Sitting, Cuff Size: Normal)   Pulse 76   Resp 14   Ht 5\' 2"  (1.575 m)   Wt 96 lb (43.5 kg)   LMP 04/04/2017   BMI 17.56 kg/m     General appearance: alert, cooperative and appears stated age Breasts: in the left breast at 2 o'clock is a 1.5 cm, irregularly shaped, mobile tender lump. No other breast lumps on either side, no skin changes or nipple inversion  No supraclavicular or axillary adenopathy   ASSESSMENT Tender breast lump, seems larger. Previously suspected fibroadenoma    PLAN Breast ultrasound Will  likely refer to a breast surgeon after the imaging Can use tylenol or ibuprofen for pain Decreasing caffeine intake may help Can use ice as needed   An After Visit Summary was printed and given to the patient.

## 2017-04-26 ENCOUNTER — Ambulatory Visit
Admission: RE | Admit: 2017-04-26 | Discharge: 2017-04-26 | Disposition: A | Discharge status: Home / Self Care | Payer: 59 | Source: Ambulatory Visit | Attending: Obstetrics and Gynecology | Admitting: Obstetrics and Gynecology

## 2017-04-26 ENCOUNTER — Other Ambulatory Visit: Payer: Self-pay | Admitting: Obstetrics and Gynecology

## 2017-04-26 DIAGNOSIS — N6320 Unspecified lump in the left breast, unspecified quadrant: Secondary | ICD-10-CM

## 2017-04-26 DIAGNOSIS — N644 Mastodynia: Secondary | ICD-10-CM

## 2017-05-01 ENCOUNTER — Ambulatory Visit
Admission: RE | Admit: 2017-05-01 | Discharge: 2017-05-01 | Disposition: A | Discharge status: Home / Self Care | Payer: 59 | Source: Ambulatory Visit | Attending: Obstetrics and Gynecology | Admitting: Obstetrics and Gynecology

## 2017-05-01 ENCOUNTER — Other Ambulatory Visit: Payer: Self-pay | Admitting: Obstetrics and Gynecology

## 2017-05-01 DIAGNOSIS — N6320 Unspecified lump in the left breast, unspecified quadrant: Secondary | ICD-10-CM

## 2017-07-22 ENCOUNTER — Other Ambulatory Visit: Payer: Self-pay

## 2017-07-22 ENCOUNTER — Ambulatory Visit (INDEPENDENT_AMBULATORY_CARE_PROVIDER_SITE_OTHER): Payer: 59 | Admitting: Obstetrics and Gynecology

## 2017-07-22 ENCOUNTER — Encounter: Payer: Self-pay | Admitting: Obstetrics and Gynecology

## 2017-07-22 VITALS — BP 110/66 | HR 102 | Resp 16 | Ht 62.5 in | Wt 95.8 lb

## 2017-07-22 DIAGNOSIS — Z23 Encounter for immunization: Secondary | ICD-10-CM

## 2017-07-22 DIAGNOSIS — Z01419 Encounter for gynecological examination (general) (routine) without abnormal findings: Secondary | ICD-10-CM

## 2017-07-22 DIAGNOSIS — D242 Benign neoplasm of left breast: Secondary | ICD-10-CM

## 2017-07-22 DIAGNOSIS — Z3169 Encounter for other general counseling and advice on procreation: Secondary | ICD-10-CM | POA: Diagnosis not present

## 2017-07-22 DIAGNOSIS — E559 Vitamin D deficiency, unspecified: Secondary | ICD-10-CM

## 2017-07-22 DIAGNOSIS — N644 Mastodynia: Secondary | ICD-10-CM

## 2017-07-22 DIAGNOSIS — Z0000 Encounter for general adult medical examination without abnormal findings: Secondary | ICD-10-CM | POA: Diagnosis not present

## 2017-07-22 NOTE — Patient Instructions (Signed)
EXERCISE AND DIET:  We recommended that you start or continue a regular exercise program for good health. Regular exercise means any activity that makes your heart beat faster and makes you sweat.  We recommend exercising at least 30 minutes per day at least 3 days a week, preferably 4 or 5.  We also recommend a diet low in fat and sugar.  Inactivity, poor dietary choices and obesity can cause diabetes, heart attack, stroke, and kidney damage, among others.    ALCOHOL AND SMOKING:  Women should limit their alcohol intake to no more than 7 drinks/beers/glasses of wine (combined, not each!) per week. Moderation of alcohol intake to this level decreases your risk of breast cancer and liver damage. And of course, no recreational drugs are part of a healthy lifestyle.  And absolutely no smoking or even second hand smoke. Most people know smoking can cause heart and lung diseases, but did you know it also contributes to weakening of your bones? Aging of your skin?  Yellowing of your teeth and nails?  CALCIUM AND VITAMIN D:  Adequate intake of calcium and Vitamin D are recommended.  The recommendations for exact amounts of these supplements seem to change often, but generally speaking 600 mg of calcium (either carbonate or citrate) and 800 units of Vitamin D per day seems prudent. Certain women may benefit from higher intake of Vitamin D.  If you are among these women, your doctor will have told you during your visit.    PAP SMEARS:  Pap smears, to check for cervical cancer or precancers,  have traditionally been done yearly, although recent scientific advances have shown that most women can have pap smears less often.  However, every woman still should have a physical exam from her gynecologist every year. It will include a breast check, inspection of the vulva and vagina to check for abnormal growths or skin changes, a visual exam of the cervix, and then an exam to evaluate the size and shape of the uterus and  ovaries.  And after 29 years of age, a rectal exam is indicated to check for rectal cancers. We will also provide age appropriate advice regarding health maintenance, like when you should have certain vaccines, screening for sexually transmitted diseases, bone density testing, colonoscopy, mammograms, etc.   MAMMOGRAMS:  All women over 40 years old should have a yearly mammogram. Many facilities now offer a "3D" mammogram, which may cost around $50 extra out of pocket. If possible,  we recommend you accept the option to have the 3D mammogram performed.  It both reduces the number of women who will be called back for extra views which then turn out to be normal, and it is better than the routine mammogram at detecting truly abnormal areas.    COLONOSCOPY:  Colonoscopy to screen for colon cancer is recommended for all women at age 50.  We know, you hate the idea of the prep.  We agree, BUT, having colon cancer and not knowing it is worse!!  Colon cancer so often starts as a polyp that can be seen and removed at colonscopy, which can quite literally save your life!  And if your first colonoscopy is normal and you have no family history of colon cancer, most women don't have to have it again for 10 years.  Once every ten years, you can do something that may end up saving your life, right?  We will be happy to help you get it scheduled when you are ready.    Be sure to check your insurance coverage so you understand how much it will cost.  It may be covered as a preventative service at no cost, but you should check your particular policy.      Preparing for Pregnancy If you are considering becoming pregnant, make an appointment to see your regular health care provider to learn how to prepare for a safe and healthy pregnancy (preconception care). During a preconception care visit, your health care provider will:  Do a complete physical exam, including a Pap test.  Take a complete medical history.  Give you  information, answer your questions, and help you resolve problems.  Preconception checklist Medical history  Tell your health care provider about any current or past medical conditions. Your pregnancy or your ability to become pregnant may be affected by chronic conditions, such as diabetes, chronic hypertension, and thyroid problems.  Include your family's medical history as well as your partner's medical history.  Tell your health care provider about any history of STIs (sexually transmitted infections).These can affect your pregnancy. In some cases, they can be passed to your baby. Discuss any concerns that you have about STIs.  If indicated, discuss the benefits of genetic testing. This testing will show whether there are any genetic conditions that may be passed from you or your partner to your baby.  Tell your health care provider about: ? Any problems you have had with conception or pregnancy. ? Any medicines you take. These include vitamins, herbal supplements, and over-the-counter medicines. ? Your history of immunizations. Discuss any vaccinations that you may need.  Diet  Ask your health care provider what to include in a healthy diet that has a balance of nutrients. This is especially important when you are pregnant or preparing to become pregnant.  Ask your health care provider to help you reach a healthy weight before pregnancy. ? If you are overweight, you may be at higher risk for certain complications, such as high blood pressure, diabetes, and preterm birth. ? If you are underweight, you are more likely to have a baby who has a low birth weight.  Lifestyle, work, and home  Let your health care provider know: ? About any lifestyle habits that you have, such as alcohol use, drug use, or smoking. ? About recreational activities that may put you at risk during pregnancy, such as downhill skiing and certain exercise programs. ? Tell your health care provider about any  international travel, especially any travel to places with an active Zika virus outbreak. ? About harmful substances that you may be exposed to at work or at home. These include chemicals, pesticides, radiation, or even litter boxes. ? If you do not feel safe at home.  Mental health  Tell your health care provider about: ? Any history of mental health conditions, including feelings of depression, sadness, or anxiety. ? Any medicines that you take for a mental health condition. These include herbs and supplements.  Home instructions to prepare for pregnancy Lifestyle  Eat a balanced diet. This includes fresh fruits and vegetables, whole grains, lean meats, low-fat dairy products, healthy fats, and foods that are high in fiber. Ask to meet with a nutritionist or registered dietitian for assistance with meal planning and goals.  Get regular exercise. Try to be active for at least 30 minutes a day on most days of the week. Ask your health care provider which activities are safe during pregnancy.  Do not use any products that contain nicotine or tobacco,   such as cigarettes and e-cigarettes. If you need help quitting, ask your health care provider.  Do not drink alcohol.  Do not take illegal drugs.  Maintain a healthy weight. Ask your health care provider what weight range is right for you.  General instructions  Keep an accurate record of your menstrual periods. This makes it easier for your health care provider to determine your baby's due date.  Begin taking prenatal vitamins and folic acid supplements daily as directed by your health care provider.  Manage any chronic conditions, such as high blood pressure and diabetes, as told by your health care provider. This is important.  How do I know that I am pregnant? You may be pregnant if you have been sexually active and you miss your period. Symptoms of early pregnancy include:  Mild cramping.  Very light vaginal bleeding  (spotting).  Feeling unusually tired.  Nausea and vomiting (morning sickness).  If you have any of these symptoms and you suspect that you might be pregnant, you can take a home pregnancy test. These tests check for a hormone in your urine (human chorionic gonadotropin, or hCG). A woman's body begins to make this hormone during early pregnancy. These tests are very accurate. Wait until at least the first day after you miss your period to take one. If the test shows that you are pregnant (you get a positive result), call your health care provider to make an appointment for prenatal care. What should I do if I become pregnant?  Make an appointment with your health care provider as soon as you suspect you are pregnant.  Do not use any products that contain nicotine, such as cigarettes, chewing tobacco, and e-cigarettes. If you need help quitting, ask your health care provider.  Do not drink alcoholic beverages. Alcohol is related to a number of birth defects.  Avoid toxic odors and chemicals.  You may continue to have sexual intercourse if it does not cause pain or other problems, such as vaginal bleeding. This information is not intended to replace advice given to you by your health care provider. Make sure you discuss any questions you have with your health care provider. Document Released: 03/01/2008 Document Revised: 11/15/2015 Document Reviewed: 10/09/2015 Elsevier Interactive Patient Education  2018 Elsevier Inc.  

## 2017-07-22 NOTE — Progress Notes (Signed)
27 y.o. G0P0000 MarriedCaucasianF here for annual exam.   She has a left breast fibroadenoma, biopsied in 1/19. She has fairly constant low level pain in the area of the fibroadenoma. Worse since her biopsy.  Sexually active, stopped using condoms in January, not actively trying. On PNV. No FH of genetic or chromosomal abnormalities.  Period Cycle (Days): 5 Period Duration (Days): 29 Period Pattern: Regular Menstrual Flow: Moderate Menstrual Control: Maxi pad Menstrual Control Change Freq (Hours): 6 Dysmenorrhea: (!) Mild Dysmenorrhea Symptoms: Cramping, Nausea  Patient's last menstrual period was 07/03/2017.          Sexually active: Yes.    The current method of family planning is none.    Exercising: Yes.    walking, yoga Smoker:  no  Health Maintenance: Pap:  07/05/16 Neg. HR HPV:neg  History of abnormal Pap:  no MMG: 05/02/17 [place] left Bx fibroadenoma PASH.   Colonoscopy:  n/a BMD:   n/a TDaP:  2008? Gardasil: n/a   reports that she has never smoked. She has never used smokeless tobacco. She reports that she does not drink alcohol or use drugs. 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    Past Medical History:  Diagnosis Date  . Anxiety   . History of chicken pox 1996  . Medical history non-contributory     Past Surgical History:  Procedure Laterality Date  . NO PAST SURGERIES      Current Outpatient Medications  Medication Sig Dispense Refill  . ibuprofen (ADVIL,MOTRIN) 200 MG tablet Take 400 mg by mouth every 6 (six) hours as needed.     No current facility-administered medications for this visit.     Family History  Problem Relation Age of Onset  . Depression Other   . Osteoporosis Other   . Breast cancer Other        paternal great aunt - breast cancer  . Heart disease Other   . Depression Paternal Aunt   . Cancer Paternal Aunt 58       paternal aunt - uterine cancer  . Cancer Maternal Aunt  19       maternal aunt - ovarian cancer  . Osteoporosis Mother     Review of Systems  Constitutional: Negative.   HENT: Negative.   Eyes: Negative.   Respiratory: Negative.   Cardiovascular: Negative.   Gastrointestinal: Negative.   Endocrine: Negative.   Genitourinary:       Breast pain Breast lumps  Musculoskeletal: Negative.   Skin: Negative.   Allergic/Immunologic: Negative.   Neurological: Negative.   Hematological: Negative.   Psychiatric/Behavioral: Negative.     Exam:   BP 110/66 (BP Location: Left Arm, Patient Position: Sitting, Cuff Size: Normal)   Pulse (!) 102   Resp 16   Ht 5' 2.5" (1.588 m)   Wt 95 lb 12.8 oz (43.5 kg)   LMP 07/03/2017   BMI 17.24 kg/m   Weight change: @WEIGHTCHANGE @ Height:   Height: 5' 2.5" (158.8 cm)  Ht Readings from Last 3 Encounters:  07/22/17 5' 2.5" (1.588 m)  04/22/17 5\' 2"  (1.575 m)  07/05/16 5' 2.5" (1.588 m)    General appearance: alert, cooperative and appears stated age Head: Normocephalic, without obvious abnormality, atraumatic Neck: no adenopathy, supple, symmetrical, trachea midline and thyroid normal to inspection and palpation Lungs: clear to auscultation bilaterally Cardiovascular: regular rate and rhythm Breasts: in the left breast at 2 o'clock is a smooth, mobile 1.5 cm lump, mildly  tender. No other lumps, no skin changes Abdomen: soft, non-tender; non distended,  no masses,  no organomegaly Extremities: extremities normal, atraumatic, no cyanosis or edema Skin: Skin color, texture, turgor normal. No rashes or lesions Lymph nodes: Cervical, supraclavicular, and axillary nodes normal. No abnormal inguinal nodes palpated Neurologic: Grossly normal   Pelvic: External genitalia:  no lesions              Urethra:  normal appearing urethra with no masses, tenderness or lesions              Bartholins and Skenes: normal                 Vagina: normal appearing vagina with normal color and discharge, no lesions               Cervix: no lesions               Bimanual Exam:  Uterus:  normal size, contour, position, consistency, mobility, non-tender              Adnexa: no mass, fullness, tenderness               Rectovaginal: Confirms               Anus:  normal sphincter tone, no lesions  Chaperone was present for exam.  A:  Well Woman with normal exam  Preconception planning, on PNV  Known fibroadenoma, having pain, interested in possible removal  Vit d def  P:   No pap this year  Information on preparing for pregnancy given  Rubella screen  Screening labs  TDAP today  Vit D

## 2017-07-23 LAB — COMPREHENSIVE METABOLIC PANEL
ALBUMIN: 4.7 g/dL (ref 3.5–5.5)
ALT: 25 IU/L (ref 0–32)
AST: 28 IU/L (ref 0–40)
Albumin/Globulin Ratio: 2 (ref 1.2–2.2)
Alkaline Phosphatase: 65 IU/L (ref 39–117)
BUN/Creatinine Ratio: 17 (ref 9–23)
BUN: 11 mg/dL (ref 6–20)
Bilirubin Total: <0.2 mg/dL (ref 0.0–1.2)
CALCIUM: 9.6 mg/dL (ref 8.7–10.2)
CO2: 23 mmol/L (ref 20–29)
Chloride: 104 mmol/L (ref 96–106)
Creatinine, Ser: 0.64 mg/dL (ref 0.57–1.00)
GFR calc Af Amer: 139 mL/min/{1.73_m2} (ref >59)
GFR, EST NON AFRICAN AMERICAN: 121 mL/min/{1.73_m2} (ref >59)
GLOBULIN, TOTAL: 2.4 g/dL (ref 1.5–4.5)
GLUCOSE: 86 mg/dL (ref 65–99)
Potassium: 4.2 mmol/L (ref 3.5–5.2)
SODIUM: 142 mmol/L (ref 134–144)
Total Protein: 7.1 g/dL (ref 6.0–8.5)

## 2017-07-23 LAB — CBC
HEMATOCRIT: 41.5 % (ref 34.0–46.6)
HEMOGLOBIN: 13.7 g/dL (ref 11.1–15.9)
MCH: 29.8 pg (ref 26.6–33.0)
MCHC: 33 g/dL (ref 31.5–35.7)
MCV: 90 fL (ref 79–97)
Platelets: 359 10*3/uL (ref 150–379)
RBC: 4.6 x10E6/uL (ref 3.77–5.28)
RDW: 13.5 % (ref 12.3–15.4)
WBC: 6.2 10*3/uL (ref 3.4–10.8)

## 2017-07-23 LAB — LIPID PANEL
CHOL/HDL RATIO: 2.7 ratio (ref 0.0–4.4)
CHOLESTEROL TOTAL: 115 mg/dL (ref 100–199)
HDL: 43 mg/dL (ref >39)
LDL CALC: 43 mg/dL (ref 0–99)
TRIGLYCERIDES: 146 mg/dL (ref 0–149)
VLDL CHOLESTEROL CAL: 29 mg/dL (ref 5–40)

## 2017-07-23 LAB — RUBELLA SCREEN: Rubella Antibodies, IGG: 13.3 index (ref >0.99)

## 2017-07-23 LAB — VITAMIN D 25 HYDROXY (VIT D DEFICIENCY, FRACTURES): Vit D, 25-Hydroxy: 33.8 ng/mL (ref 30.0–100.0)

## 2017-07-31 ENCOUNTER — Telehealth: Payer: Self-pay | Admitting: Obstetrics and Gynecology

## 2017-07-31 NOTE — Telephone Encounter (Signed)
[facility] returned Rosa's call about this patient.

## 2017-08-11 ENCOUNTER — Encounter: Payer: Self-pay | Admitting: Obstetrics and Gynecology

## 2017-08-12 ENCOUNTER — Telehealth: Payer: Self-pay | Admitting: Obstetrics and Gynecology

## 2017-08-12 ENCOUNTER — Encounter: Payer: Self-pay | Admitting: Obstetrics and Gynecology

## 2017-08-12 ENCOUNTER — Ambulatory Visit (INDEPENDENT_AMBULATORY_CARE_PROVIDER_SITE_OTHER): Payer: 59 | Admitting: Obstetrics and Gynecology

## 2017-08-12 ENCOUNTER — Other Ambulatory Visit: Payer: Self-pay

## 2017-08-12 VITALS — BP 112/80 | HR 88 | Resp 14 | Wt 96.0 lb

## 2017-08-12 DIAGNOSIS — N926 Irregular menstruation, unspecified: Secondary | ICD-10-CM

## 2017-08-12 DIAGNOSIS — O209 Hemorrhage in early pregnancy, unspecified: Secondary | ICD-10-CM

## 2017-08-12 LAB — POCT URINE PREGNANCY: Preg Test, Ur: POSITIVE — AB

## 2017-08-12 NOTE — Telephone Encounter (Signed)
Left message to call Linkon Siverson at [phone]. 

## 2017-08-12 NOTE — Patient Instructions (Signed)
Call with heavy bleeding or significant abdominal discomfort

## 2017-08-12 NOTE — Telephone Encounter (Signed)
-----   Message from [place], Generic sent at 08/11/2017 10:21 AM EDT -----    Hi,    I started spotting on day 9 of my cycle. It's now day 12 and getting a little heavier. What advice do you have for this? It's never happened to me before and I don't know if this is a cause of concern or not.    Thank you,   Kara Landry

## 2017-08-12 NOTE — Telephone Encounter (Signed)
Spoke with patient. Patient states that her LMP was 08/01/2017 for 5 days. Patient started bleeding again on cycle day 9 and is still bleeding today which is day 13. Not on any form of OCP. Reports yesterday her bleeding increased to heavier than a normal menses. Patient is concerned about irregular bleeding pattern. Has not taken UPT. Appointment scheduled for today at 1:45 pm with Dr.Jertson. Patient is agreeable to date and time.  Routing to provider for final review. Patient agreeable to disposition. Will close encounter.

## 2017-08-12 NOTE — Progress Notes (Signed)
GYNECOLOGY  VISIT   HPI: 27 y.o.   Married  Caucasian  female   G0P0000 with Patient's last menstrual period was 08/01/2017.   here c/o irregular uterine bleeding    Sexually active, stopped using condoms in January, hasn't been actively trying to conceive. Her cycles are monthly. This cycle came on time, was normal, bleed x 5 days. She could wear a pad all day. Mild cramps. The bleeding stopped, after a couple of days of no bleeding she started bleeding on 08/08/17. Initially light, never heavy. She was using a panty liner. Today just minimal spotting. She notes slight, intermittent cramping.  Her breast are tender. No nausea or fatigue.    GYNECOLOGIC HISTORY: Patient's last menstrual period was 08/01/2017. Contraception:none  Menopausal hormone therapy: none         OB History    Gravida  0   Para  0   Term  0   Preterm  0   AB  0   Living  0     SAB  0   TAB  0   Ectopic  0   Multiple  0   Live Births  0              Patient Active Problem List   Diagnosis Date Noted  . Neck pain 07/15/2015  . Generalized anxiety disorder 07/08/2015  . Left breast mass 12/24/2013    Past Medical History:  Diagnosis Date  . Anxiety   . History of chicken pox 1996  . Medical history non-contributory     Past Surgical History:  Procedure Laterality Date  . NO PAST SURGERIES      Current Outpatient Medications  Medication Sig Dispense Refill  . ibuprofen (ADVIL,MOTRIN) 200 MG tablet Take 400 mg by mouth every 6 (six) hours as needed.     No current facility-administered medications for this visit.      ALLERGIES: Patient has no known allergies.  Family History  Problem Relation Age of Onset  . Depression Other   . Osteoporosis Other   . Breast cancer Other        paternal great aunt - breast cancer  . Heart disease Other   . Depression Paternal Aunt   . Cancer Paternal Aunt 6       paternal aunt - uterine cancer  . Cancer Maternal Aunt 47   maternal aunt - ovarian cancer  . Osteoporosis Mother     Social History   Socioeconomic History  . Marital status: Married    Spouse name: Not on file  . Number of children: 0  . Years of education: Not on file  . Highest education level: Not on file  Occupational History  . Occupation: Ship broker    Comment: elementary education  Social Needs  . Financial resource strain: Not on file  . Food insecurity:    Worry: Not on file    Inability: Not on file  . Transportation needs:    Medical: Not on file    Non-medical: Not on file  Tobacco Use  . Smoking status: Never Smoker  . Smokeless tobacco: Never Used  Substance and Sexual Activity  . Alcohol use: No    Alcohol/week: 0.0 oz  . Drug use: No  . Sexual activity: Yes    Partners: Male    Birth control/protection: None  Lifestyle  . Physical activity:    Days per week: Not on file    Minutes per session: Not on file  .  Stress: Not on file  Relationships  . Social connections:    Talks on phone: Not on file    Gets together: Not on file    Attends religious service: Not on file    Active member of club or organization: Not on file    Attends meetings of clubs or organizations: Not on file    Relationship status: Not on file  . Intimate partner violence:    Fear of current or ex partner: Not on file    Emotionally abused: Not on file    Physically abused: Not on file    Forced sexual activity: Not on file  Other Topics Concern  . Not on file  Social History Narrative  . Not on file    Review of Systems  Constitutional: Negative.   HENT: Negative.   Eyes: Negative.   Respiratory: Negative.   Cardiovascular: Negative.   Gastrointestinal: Negative.   Genitourinary:       Irregular uterine bleeding   Musculoskeletal: Negative.   Skin: Negative.   Neurological: Negative.   Endo/Heme/Allergies: Negative.   Psychiatric/Behavioral: Negative.     PHYSICAL EXAMINATION:    BP 112/80 (BP Location: Right Arm,  Patient Position: Sitting, Cuff Size: Normal)   Pulse 88   Resp 14   Wt 96 lb (43.5 kg)   LMP 08/01/2017   BMI 17.28 kg/m     General appearance: alert, cooperative and appears stated age Abdomen: soft, non-tender; non distended, no masses,  no organomegaly  Pelvic: External genitalia:  no lesions              Urethra:  normal appearing urethra with no masses, tenderness or lesions              Bartholins and Skenes: normal                 Vagina: normal appearing vagina with normal color and discharge, no lesions              Cervix: no lesions and closed, minimal brown d/c noted at the external os.               Bimanual Exam:  Uterus:  normal size, contour, position, consistency, mobility, non-tender              Adnexa: no mass, fullness, tenderness                Chaperone was present for exam.  ASSESSMENT AUB, +UPT First trimester bleeding, no ectopic risk factors    PLAN [facility], T&S Call with heavy bleeding or significant pain Further plans depending on results   An After Visit Summary was printed and given to the patient.

## 2017-08-13 ENCOUNTER — Other Ambulatory Visit: Payer: Self-pay | Admitting: Obstetrics & Gynecology

## 2017-08-13 ENCOUNTER — Other Ambulatory Visit: Payer: Self-pay | Admitting: *Deleted

## 2017-08-13 ENCOUNTER — Inpatient Hospital Stay (HOSPITAL_COMMUNITY)
Admission: AD | Admit: 2017-08-13 | Discharge: 2017-08-13 | Disposition: A | Discharge status: Home / Self Care | Payer: 59 | Source: Ambulatory Visit | Attending: Gynecology | Admitting: Gynecology

## 2017-08-13 ENCOUNTER — Other Ambulatory Visit: Payer: Self-pay | Admitting: Obstetrics and Gynecology

## 2017-08-13 DIAGNOSIS — O26899 Other specified pregnancy related conditions, unspecified trimester: Secondary | ICD-10-CM

## 2017-08-13 DIAGNOSIS — O26891 Other specified pregnancy related conditions, first trimester: Secondary | ICD-10-CM | POA: Insufficient documentation

## 2017-08-13 DIAGNOSIS — Z79899 Other long term (current) drug therapy: Secondary | ICD-10-CM | POA: Diagnosis not present

## 2017-08-13 DIAGNOSIS — O209 Hemorrhage in early pregnancy, unspecified: Secondary | ICD-10-CM

## 2017-08-13 DIAGNOSIS — O26859 Spotting complicating pregnancy, unspecified trimester: Secondary | ICD-10-CM

## 2017-08-13 DIAGNOSIS — Z3A01 Less than 8 weeks gestation of pregnancy: Secondary | ICD-10-CM | POA: Insufficient documentation

## 2017-08-13 DIAGNOSIS — O26851 Spotting complicating pregnancy, first trimester: Secondary | ICD-10-CM | POA: Insufficient documentation

## 2017-08-13 DIAGNOSIS — Z6791 Unspecified blood type, Rh negative: Secondary | ICD-10-CM

## 2017-08-13 LAB — ABO/RH: ABO/RH(D): A NEG

## 2017-08-13 MED ORDER — RHO D IMMUNE GLOBULIN 1500 UNIT/2ML IJ SOSY: 300.0000 ug | PREFILLED_SYRINGE | Freq: Once | INTRAMUSCULAR | Status: DC

## 2017-08-13 MED ORDER — RHO D IMMUNE GLOBULIN 1500 UNIT/2ML IJ SOSY
300.0000 ug | PREFILLED_SYRINGE | Freq: Once | INTRAMUSCULAR | Status: AC
  Administered 2017-08-13: 300 ug via INTRAMUSCULAR
  Filled 2017-08-13: qty 2

## 2017-08-13 NOTE — MAU Provider Note (Addendum)
Chief Complaint: Injections (Rhophylac)   First Provider Initiated Contact with Patient 08/13/17 1714      SUBJECTIVE HPI: Kara Landry is a 27 y.o. G0P0000 female at 5.6 wks by LMP of 07/03/2017 who was sent to maternity admissions for Rhophylac injection. She is being followed by [facility] for (+) pregnancy, spotting in first trimester, Rh negative, and repeat BHCG. Her last BHCG was 297 on 08/12/2017.  Past Medical History:  Diagnosis Date  . Anxiety   . History of chicken pox 1996  . Medical history non-contributory    Past Surgical History:  Procedure Laterality Date  . NO PAST SURGERIES     Social History   Socioeconomic History  . Marital status: Married    Spouse name: Not on file  . Number of children: 0  . Years of education: Not on file  . Highest education level: Not on file  Occupational History  . Occupation: Ship broker    Comment: elementary education  Social Needs  . Financial resource strain: Not on file  . Food insecurity:    Worry: Not on file    Inability: Not on file  . Transportation needs:    Medical: Not on file    Non-medical: Not on file  Tobacco Use  . Smoking status: Never Smoker  . Smokeless tobacco: Never Used  Substance and Sexual Activity  . Alcohol use: No    Alcohol/week: 0.0 oz  . Drug use: No  . Sexual activity: Yes    Partners: Male    Birth control/protection: None  Lifestyle  . Physical activity:    Days per week: Not on file    Minutes per session: Not on file  . Stress: Not on file  Relationships  . Social connections:    Talks on phone: Not on file    Gets together: Not on file    Attends religious service: Not on file    Active member of club or organization: Not on file    Attends meetings of clubs or organizations: Not on file    Relationship status: Not on file  . Intimate partner violence:    Fear of current or ex partner: Not on file    Emotionally abused: Not on file    Physically abused: Not on file   Forced sexual activity: Not on file  Other Topics Concern  . Not on file  Social History Narrative  . Not on file   No current facility-administered medications on file prior to encounter.    Current Outpatient Medications on File Prior to Encounter  Medication Sig Dispense Refill  . ibuprofen (ADVIL,MOTRIN) 200 MG tablet Take 400 mg by mouth every 6 (six) hours as needed.     No Known Allergies  ROS:  Review of Systems  Constitutional: Negative.   HENT: Negative.   Eyes: Negative.   Respiratory: Negative.   Cardiovascular: Negative.   Gastrointestinal: Negative.   Endocrine: Negative.   Genitourinary: Positive for vaginal bleeding (spotting).  Musculoskeletal: Negative.   Skin: Negative.   Allergic/Immunologic: Negative.   Neurological: Negative.   Hematological: Negative.   Psychiatric/Behavioral: Negative.     I have reviewed patient's Past Medical Hx, Surgical Hx, Family Hx, Social Hx, medications and allergies.   Physical Exam   Physical Exam  Nursing note and vitals reviewed. Constitutional: She is oriented to person, place, and time. She appears well-developed and well-nourished.  HENT:  Head: Normocephalic and atraumatic.  Eyes: Pupils are equal, round, and reactive  to light.  Neck: Normal range of motion.  Respiratory: Effort normal.  Genitourinary:  Genitourinary Comments: Pelvic not indicated; pt just evaluated in office prior to arrival to MAU  Musculoskeletal: Normal range of motion.  Neurological: She is alert and oriented to person, place, and time.  Skin: Skin is warm and dry.  Psychiatric: She has a normal mood and affect. Her behavior is normal. Judgment and thought content normal.    MDM Rhophylac work-up and Rhophylac 300 mcg injection ordered   ASSESSMENT MSE Complete  PLAN Discharge patient after medication administration and necessary observation  Laury Deep, CNM 08/13/2017 5:15 PM

## 2017-08-13 NOTE — Progress Notes (Signed)
Rhogam orders placed for pt.  Dr. Talbert Nan sent her to MAU for injection due to first trimester bleeding and rh negative status.

## 2017-08-13 NOTE — Progress Notes (Signed)
bhcg

## 2017-08-14 ENCOUNTER — Telehealth: Payer: Self-pay

## 2017-08-14 ENCOUNTER — Other Ambulatory Visit (INDEPENDENT_AMBULATORY_CARE_PROVIDER_SITE_OTHER): Payer: 59

## 2017-08-14 DIAGNOSIS — Z3201 Encounter for pregnancy test, result positive: Secondary | ICD-10-CM

## 2017-08-14 LAB — RH IG WORKUP (INCLUDES ABO/RH)
ABO/RH(D): A NEG
ANTIBODY SCREEN: NEGATIVE
Gestational Age(Wks): 5.6
UNIT DIVISION: 0

## 2017-08-14 LAB — BETA HCG QUANT (REF LAB): hCG Quant: 430 m[IU]/mL

## 2017-08-14 NOTE — Telephone Encounter (Signed)
If possible, it would be preferable to get her lab work in the am so the results are definitely back prior to the weekend.

## 2017-08-14 NOTE — Telephone Encounter (Signed)
Spoke with patient. After review of  HCG with Dr.Jertson. HCG is now risen to 430. Patient will need to schedule follow up lab appointment on Friday. Patient is agreeable. Appointment scheduled for lab on 08/16/2017 at 1:30 pm. Patient is agreeable to date and time. Order entered.  Routing to provider for final review. Patient agreeable to disposition. Will close encounter.

## 2017-08-15 ENCOUNTER — Ambulatory Visit (INDEPENDENT_AMBULATORY_CARE_PROVIDER_SITE_OTHER): Payer: 59 | Admitting: Obstetrics and Gynecology

## 2017-08-15 ENCOUNTER — Telehealth: Payer: Self-pay

## 2017-08-15 ENCOUNTER — Encounter: Payer: Self-pay | Admitting: Obstetrics and Gynecology

## 2017-08-15 ENCOUNTER — Ambulatory Visit: Payer: 59 | Admitting: Obstetrics and Gynecology

## 2017-08-15 ENCOUNTER — Inpatient Hospital Stay (HOSPITAL_COMMUNITY)
Admission: AD | Admit: 2017-08-15 | Discharge: 2017-08-15 | Disposition: A | Discharge status: Home / Self Care | Payer: 59 | Source: Ambulatory Visit | Attending: Obstetrics and Gynecology | Admitting: Obstetrics and Gynecology

## 2017-08-15 ENCOUNTER — Inpatient Hospital Stay (HOSPITAL_COMMUNITY): Payer: 59

## 2017-08-15 ENCOUNTER — Other Ambulatory Visit: Payer: Self-pay

## 2017-08-15 ENCOUNTER — Encounter (HOSPITAL_COMMUNITY): Payer: Self-pay | Admitting: *Deleted

## 2017-08-15 DIAGNOSIS — Z6791 Unspecified blood type, Rh negative: Secondary | ICD-10-CM | POA: Diagnosis not present

## 2017-08-15 DIAGNOSIS — R1031 Right lower quadrant pain: Secondary | ICD-10-CM | POA: Insufficient documentation

## 2017-08-15 DIAGNOSIS — N83201 Unspecified ovarian cyst, right side: Secondary | ICD-10-CM | POA: Diagnosis not present

## 2017-08-15 DIAGNOSIS — O3680X0 Pregnancy with inconclusive fetal viability, not applicable or unspecified: Secondary | ICD-10-CM

## 2017-08-15 DIAGNOSIS — O209 Hemorrhage in early pregnancy, unspecified: Secondary | ICD-10-CM

## 2017-08-15 DIAGNOSIS — O26892 Other specified pregnancy related conditions, second trimester: Secondary | ICD-10-CM | POA: Diagnosis not present

## 2017-08-15 DIAGNOSIS — O3481 Maternal care for other abnormalities of pelvic organs, first trimester: Secondary | ICD-10-CM | POA: Diagnosis not present

## 2017-08-15 DIAGNOSIS — Z3A00 Weeks of gestation of pregnancy not specified: Secondary | ICD-10-CM | POA: Diagnosis not present

## 2017-08-15 DIAGNOSIS — O26891 Other specified pregnancy related conditions, first trimester: Secondary | ICD-10-CM | POA: Insufficient documentation

## 2017-08-15 DIAGNOSIS — O99341 Other mental disorders complicating pregnancy, first trimester: Secondary | ICD-10-CM | POA: Insufficient documentation

## 2017-08-15 DIAGNOSIS — F419 Anxiety disorder, unspecified: Secondary | ICD-10-CM | POA: Diagnosis not present

## 2017-08-15 DIAGNOSIS — R109 Unspecified abdominal pain: Secondary | ICD-10-CM | POA: Diagnosis not present

## 2017-08-15 DIAGNOSIS — O26899 Other specified pregnancy related conditions, unspecified trimester: Secondary | ICD-10-CM

## 2017-08-15 HISTORY — DX: Headache, unspecified: R51.9

## 2017-08-15 HISTORY — DX: Headache: R51

## 2017-08-15 HISTORY — DX: Unspecified infectious disease: B99.9

## 2017-08-15 LAB — URINALYSIS, ROUTINE W REFLEX MICROSCOPIC
BILIRUBIN URINE: NEGATIVE
Glucose, UA: NEGATIVE mg/dL
Ketones, ur: NEGATIVE mg/dL
Nitrite: NEGATIVE
PH: 6 (ref 5.0–8.0)
Protein, ur: NEGATIVE mg/dL
SPECIFIC GRAVITY, URINE: 1.008 (ref 1.005–1.030)

## 2017-08-15 LAB — CBC
HCT: 40.8 % (ref 36.0–46.0)
HEMOGLOBIN: 14 g/dL (ref 12.0–15.0)
MCH: 30.5 pg (ref 26.0–34.0)
MCHC: 34.3 g/dL (ref 30.0–36.0)
MCV: 88.9 fL (ref 78.0–100.0)
Platelets: 335 10*3/uL (ref 150–400)
RBC: 4.59 MIL/uL (ref 3.87–5.11)
RDW: 13 % (ref 11.5–15.5)
WBC: 8.6 10*3/uL (ref 4.0–10.5)

## 2017-08-15 LAB — WET PREP, GENITAL
Clue Cells Wet Prep HPF POC: NONE SEEN
SPERM: NONE SEEN
Trich, Wet Prep: NONE SEEN
Yeast Wet Prep HPF POC: NONE SEEN

## 2017-08-15 LAB — ABO AND RH: Rh Factor: NEGATIVE

## 2017-08-15 LAB — ANTIBODY SCREEN: Antibody Screen: NEGATIVE

## 2017-08-15 LAB — BETA HCG QUANT (REF LAB): hCG Quant: 297 m[IU]/mL

## 2017-08-15 LAB — HCG, QUANTITATIVE, PREGNANCY: hCG, Beta Chain, Quant, S: 527 m[IU]/mL — ABNORMAL HIGH (ref <5)

## 2017-08-15 NOTE — Telephone Encounter (Signed)
Spoke with patient. Advised reviewed with Dr.Jertson and patient should go to [facility] MAU for evaluation at this time. Should not eat or drink anything from this point forward until all evaluation is complete. Patient verbalizes understanding and will speak with her employer so that she can head to MAU at this time.  Routing to provider for final review.

## 2017-08-15 NOTE — MAU Note (Signed)
Found out pregnant on Monday, has had hormone levels checked in office. Had started bleeding last Friday, has been tapering off, one spot yesterday, none since. Today having sharp intermittent pain in RLQ.

## 2017-08-15 NOTE — Telephone Encounter (Signed)
Patient reports she is unable to come at any other time due to work schedule. Spoke with Myriam Jacobson in lab, should be able to receive STAT before the end of the work day 5/17.

## 2017-08-15 NOTE — Discharge Instructions (Signed)

## 2017-08-15 NOTE — Telephone Encounter (Signed)
Okay to close encounter or is further follow up needed? °

## 2017-08-15 NOTE — Progress Notes (Signed)
The patient was just seen at the MAU (see separate note) with c/o intermittent RLQ abdominal pain. She is being followed with first trimester bleeding and an abnormally rising BhcG.  At time of her +UPT on 5/13 her BhcG was 297, on 5/15 it was 430 and today (5/16) 527.  On ultrasound today she has a 4.1 cm right hemorrhagic cyst. No IUP or signs of ectopic pregnancy were noted.  I reviewed with the patient and her husband that her BhcG is rising, but not normally. I explained that it is possible this will still be a viable pregnancy, but there is concern for a non-viable IUP or an ectopic pregnancy. I recommended that we continue to follow her BhcG and that it will either rise, plateau or decrease. If it rises to 2000 we should be able to see an IUP. If her BhCG gets to 2000 without an IUP or it plateaus, then we would discuss possible medical treatment or a D&C.   I advised her to call with an increase in pain or any questions or concerns. She has a F/U BhCG tomorrow.

## 2017-08-15 NOTE — Telephone Encounter (Signed)
Patient is being followed for early pregnancy. HCG was 297 on 5/13, 430 on 5/15. Spoke with patient. Patient states that she woke up this morning and is having intermittent sharp right lower quadrant pain. Reports this is only on the right side. Is a 4/5-10 when it occurs. Had 1 spot of bleeding yesterday. No bleeding today. Denies nausea, vomiting, fever, chills, or change in BM. Advised will review with Dr.Jertson and return call. Patient is agreeable.  Visit Follow-Up Question  Message [id number]  From Goodrich, Amada W To Salvadore Dom, MD Sent 08/15/2017 7:27 AM  Hi,   I found out I'm pregnant this past Monday (5/13) in your office.   I woke up today (5/16) with very sharp pains on my lower right side. Is this normal? Should I be concerned?   Thanks,  Kara Landry   Responsible Party   Pool - [facility] Clinical Pool Message taken by Gwendlyn Deutscher, RN on 08/15/2017 8:05 AM  No actions have been taken on this message.

## 2017-08-15 NOTE — MAU Provider Note (Signed)
History     CSN: [id number]  Arrival date and time: 08/15/17 6644   First Provider Initiated Contact with Patient 08/15/17 [phone]      Chief Complaint  Patient presents with  . Abdominal Pain   G1 @unknown  gestation here with RLQ pain. Pain started this am. Describes as intermittent and sharp. Rates 4/10. No alleviating or aggravating factors. Has not taken anything for it. Denies urinary sx. No VB or discharge. No fevers. No N/V. She has been followed for spotting in pregnancy. Quant HCG on 08/12/17 was 297 and quant yesterday was 430 (60% rise).   OB History    Gravida  1   Para  0   Term  0   Preterm  0   AB  0   Living  0     SAB  0   TAB  0   Ectopic  0   Multiple  0   Live Births  0           Past Medical History:  Diagnosis Date  . Anxiety   . Headache   . History of chicken pox 1996  . Infection    UTI  . Medical history non-contributory     Past Surgical History:  Procedure Laterality Date  . NO PAST SURGERIES    . WISDOM TOOTH EXTRACTION      Family History  Problem Relation Age of Onset  . Depression Other   . Osteoporosis Other   . Breast cancer Other        paternal great aunt - breast cancer  . Heart disease Other   . Depression Paternal Aunt   . Cancer Paternal Aunt 47       paternal aunt - uterine cancer  . Cancer Maternal Aunt 12       maternal aunt - ovarian cancer  . Osteoporosis Mother   . Heart disease Maternal Grandfather     Social History   Tobacco Use  . Smoking status: Never Smoker  . Smokeless tobacco: Never Used  Substance Use Topics  . Alcohol use: No    Alcohol/week: 0.0 oz  . Drug use: No    Allergies: No Known Allergies  No medications prior to admission.    Review of Systems  Constitutional: Negative for fever.  Gastrointestinal: Positive for abdominal pain. Negative for nausea and vomiting.  Genitourinary: Negative for dysuria, hematuria, urgency, vaginal bleeding and vaginal discharge.    Physical Exam   Blood pressure 125/82, pulse (!) 123, temperature 98.7 F (37.1 C), temperature source Oral, resp. rate 17, height 5\' 3"  (1.6 m), weight 94 lb 4 oz (42.8 kg), last menstrual period 07/03/2017.  Physical Exam  Constitutional: She is oriented to person, place, and time. She appears well-developed and well-nourished. No distress.  HENT:  Head: Normocephalic and atraumatic.  Neck: Normal range of motion.  Respiratory: Effort normal. No respiratory distress.  GI: Soft. She exhibits no distension and no mass. There is no tenderness. There is no rebound and no guarding.  Genitourinary:  Genitourinary Comments: External: no lesions or erythema Vagina: rugated, pink, moist, scant brown bloody discharge Uterus: non enlarged, anteverted, + tender, no CMT Adnexae: +enlarged and tender right adnexa, no enlargement or tenderness left Cervix closed/long   Musculoskeletal: Normal range of motion.  Neurological: She is alert and oriented to person, place, and time.  Skin: Skin is warm and dry.  Psychiatric: She has a normal mood and affect.   Results for orders placed or  performed during the hospital encounter of 08/15/17 (from the past 24 hour(s))  Urinalysis, Routine w reflex microscopic     Status: Abnormal   Collection Time: 08/15/17  9:02 AM  Result Value Ref Range   Color, Urine YELLOW YELLOW   APPearance CLOUDY (A) CLEAR   Specific Gravity, Urine 1.008 1.005 - 1.030   pH 6.0 5.0 - 8.0   Glucose, UA NEGATIVE NEGATIVE mg/dL   Hgb urine dipstick MODERATE (A) NEGATIVE   Bilirubin Urine NEGATIVE NEGATIVE   Ketones, ur NEGATIVE NEGATIVE mg/dL   Protein, ur NEGATIVE NEGATIVE mg/dL   Nitrite NEGATIVE NEGATIVE   Leukocytes, UA SMALL (A) NEGATIVE   RBC / HPF 0-5 0 - 5 RBC/hpf   WBC, UA 6-10 0 - 5 WBC/hpf   Bacteria, UA MANY (A) NONE SEEN   Squamous Epithelial / LPF 11-20 0 - 5   Mucus PRESENT   CBC     Status: None   Collection Time: 08/15/17  9:21 AM  Result Value Ref  Range   WBC 8.6 4.0 - 10.5 K/uL   RBC 4.59 3.87 - 5.11 MIL/uL   Hemoglobin 14.0 12.0 - 15.0 g/dL   HCT 40.8 36.0 - 46.0 %   MCV 88.9 78.0 - 100.0 fL   MCH 30.5 26.0 - 34.0 pg   MCHC 34.3 30.0 - 36.0 g/dL   RDW 13.0 11.5 - 15.5 %   Platelets 335 150 - 400 K/uL  hCG, quantitative, pregnancy     Status: Abnormal   Collection Time: 08/15/17  9:40 AM  Result Value Ref Range   hCG, Beta Chain, Quant, S 527 (H) <5 mIU/mL  Wet prep, genital     Status: Abnormal   Collection Time: 08/15/17  9:40 AM  Result Value Ref Range   Yeast Wet Prep HPF POC NONE SEEN NONE SEEN   Trich, Wet Prep NONE SEEN NONE SEEN   Clue Cells Wet Prep HPF POC NONE SEEN NONE SEEN   WBC, Wet Prep HPF POC MANY (A) NONE SEEN   Sperm NONE SEEN    US Ob Less Than 14 Weeks With Ob Transvaginal  Result Date: 08/15/2017 CLINICAL DATA:  Intermittent right lower quadrant pain. Estimated gestational age of [redacted] weeks, 1 day by LMP. EXAM: OBSTETRIC <14 WK [place] AND TRANSVAGINAL OB US TECHNIQUE: Both transabdominal and transvaginal ultrasound examinations were performed for complete evaluation of the gestation as well as the maternal uterus, adnexal regions, and pelvic cul-de-sac. Transvaginal technique was performed to assess early pregnancy. COMPARISON:  None. FINDINGS: Intrauterine gestational sac: None Yolk sac:  Not Visualized. Embryo:  Not Visualized. Cardiac Activity: Not Visualized. Maternal uterus/adnexae: Large 4.1 x 3.6 x 3.9 cm cyst in the right ovary with internal reticular echoes and no vascularity. The left ovary is unremarkable. IMPRESSION: 1. No IUP is visualized. By definition, in the setting of a positive pregnancy test, this reflects a pregnancy of unknown location. Differential considerations include early normal IUP, abnormal IUP/missed abortion, or nonvisualized ectopic pregnancy. Serial beta HCG is suggested. Consider repeat pelvic ultrasound in 14 days. 2. 4.1 cm hemorrhagic cyst in the right ovary. No further follow-up  is required. This recommendation follows the consensus statement: Management of Asymptomatic Ovarian and Other Adnexal Cysts Imaged at [place]: Society of Radiologists in [place]. Radiology 2010; [phone]. Electronically Signed   By: Titus Dubin M.D.   On: 08/15/2017 10:28   MAU Course  Procedures  MDM Labs and [place] ordered and reviewed. UA shows blood, many  bacteria and WBCs, although somewhat contaminated, will send UC. No IUP or adnexal mass seen on [place], 4cm right ovarian cyst-could be cause of pain. Presentation, clinical findings, and plan discussed with Dr. Talbert Nan. Stable for discharge. Pt to proceed now to GYN office to discuss plan with Dr. Talbert Nan.  Assessment and Plan   1. Rh negative status during pregnancy in first trimester   2. Abdominal pain in pregnancy   3. Pregnancy, location unknown   4. Right ovarian cyst    Discharge home Follow up with Dr. Talbert Nan as planned Ectopic/return precautions to MAU Tylenol prn  Allergies as of 08/15/2017   No Known Allergies     Medication List    You have not been prescribed any medications.    Julianne Handler, CNM 08/15/2017, 10:56 AM

## 2017-08-16 ENCOUNTER — Telehealth: Payer: Self-pay | Admitting: *Deleted

## 2017-08-16 ENCOUNTER — Other Ambulatory Visit (INDEPENDENT_AMBULATORY_CARE_PROVIDER_SITE_OTHER): Payer: 59

## 2017-08-16 ENCOUNTER — Other Ambulatory Visit: Payer: Self-pay | Admitting: Obstetrics & Gynecology

## 2017-08-16 DIAGNOSIS — O209 Hemorrhage in early pregnancy, unspecified: Secondary | ICD-10-CM

## 2017-08-16 DIAGNOSIS — Z3201 Encounter for pregnancy test, result positive: Secondary | ICD-10-CM

## 2017-08-16 LAB — BETA HCG QUANT (REF LAB): hCG Quant: 668 m[IU]/mL

## 2017-08-16 LAB — CULTURE, OB URINE

## 2017-08-16 LAB — GC/CHLAMYDIA PROBE AMP (~~LOC~~) NOT AT ARMC
Chlamydia: NEGATIVE
NEISSERIA GONORRHEA: NEGATIVE

## 2017-08-16 NOTE — Telephone Encounter (Signed)
Call to patient. Notified of results and recommendations from Dr Sabra Heck (covering for Dr Talbert Nan) for hospital labs and ultrasound on Sun, 08-18-17.  Detailed discussion on bleeding and pain precautions, N&V, dizziness, weakness- if occurs, go to MAU or call MD on call. Questions answered, support given.      Patient agreeable to all instructions.   Encounter closed.

## 2017-08-16 NOTE — Telephone Encounter (Signed)
-----   Message from Megan Salon, MD sent at 08/16/2017  5:56 PM EDT ----- Please let pt know HCG has risen 55%.  Needs repeat HCG and ultrasound in two days.  This should be done at [facility] in the MAU.  I have contacted the MAU and notified them of pt's names, DOB, history.  Orders placed for HCG and Ob ultrasound in pt <14 weeks.  I will be contacted directly.  Please give pain, bleeding precautions in regards to possible ectopic pregnancy.  I also notified Dr. Raphael Gibney who is on call this weekend about pt, just in case, so he is aware of her current pregnancy and possible non-viable or possible ectopic pregnancy.

## 2017-08-18 ENCOUNTER — Inpatient Hospital Stay (HOSPITAL_COMMUNITY): Payer: 59

## 2017-08-18 ENCOUNTER — Other Ambulatory Visit: Payer: Self-pay

## 2017-08-18 ENCOUNTER — Inpatient Hospital Stay (HOSPITAL_COMMUNITY)
Admission: AD | Admit: 2017-08-18 | Discharge: 2017-08-18 | Disposition: A | Discharge status: Home / Self Care | Payer: 59 | Source: Ambulatory Visit | Attending: Obstetrics & Gynecology | Admitting: Obstetrics & Gynecology

## 2017-08-18 DIAGNOSIS — O0281 Inappropriate change in quantitative human chorionic gonadotropin (hCG) in early pregnancy: Secondary | ICD-10-CM | POA: Diagnosis present

## 2017-08-18 DIAGNOSIS — R109 Unspecified abdominal pain: Secondary | ICD-10-CM

## 2017-08-18 DIAGNOSIS — R891 Abnormal level of hormones in specimens from other organs, systems and tissues: Secondary | ICD-10-CM | POA: Diagnosis not present

## 2017-08-18 DIAGNOSIS — R102 Pelvic and perineal pain: Secondary | ICD-10-CM

## 2017-08-18 LAB — HCG, QUANTITATIVE, PREGNANCY: hCG, Beta Chain, Quant, S: 988 m[IU]/mL — ABNORMAL HIGH (ref <5)

## 2017-08-18 NOTE — MAU Provider Note (Signed)
27 yo G1 at unknown gestation being followed for slowly rising HCG levels here for repeat HCG and PUS today.  HCG 08/12/17:  297 HCG 08/14/17:  430 HCG 08/16/17:  668 HCG 08/18/17 (today):  998 (47% increase)  Ultrasound today showed no clear IUP and no adnexal masses.  She does have a 4.0 x 3.8cm hemorrhagic right ovarian cyst with mild free fluid in the pelvis.  Ultrasound on 08/15/17 showed a 4.1 x 3.6 x 3.9cm cyst in the right ovary.    Reviewed all results with pt today.  Images from ultrasound reviewed personally with pt as well.  She is having minimal cramping that has actually improved in the last two days.  Denies vaginal bleeding, SOB, palpitations.  Feels well.    Reviewed typical threshold for seeing evidence of IUD at 2000 and we are not at that point yet.  Also reviewed normal pregnancy HCG increase of doubling every 2 days.  However, her hormone levels are not below the 35% rise where non-viable pregnancy is diagnostic as well.    As pt is stable, feel continued monitoring with repeat HCG and ultrasound in 48 hours is appropriate.  Also reviewed treatment with MTX including side effects and risks.  Risks include ruptured ectopic pregnancy, hemorrhage, surgery, and other life threatening complications.  Typical follow up after MTX treatment also reviewed.  We did briefly review surgical treatment but I do not feel that is the right choice at this time due to unknown location of pregnancy.    This is a desires pregnancy and she and spouse are not yet ready to proceed with MTX.  Will plan to monitor with repeat HCG and ultrasound in two days.  Pt will be contacted by office in AM for scheduling.  Strict precautions for returning to MAU given.  She and spouse voice clear understanding.

## 2017-08-18 NOTE — Progress Notes (Signed)
Dr. Sabra Heck into see pt and review U/S & lab results.  MD states she will instruct pt to return for follow up in her office.

## 2017-08-18 NOTE — MAU Note (Signed)
Pt presents fro repeat HCG level and ultrasound.  Reports mild abdominal cramping.  Denies VB.

## 2017-08-19 ENCOUNTER — Telehealth: Payer: Self-pay | Admitting: Obstetrics and Gynecology

## 2017-08-19 DIAGNOSIS — O209 Hemorrhage in early pregnancy, unspecified: Secondary | ICD-10-CM

## 2017-08-19 NOTE — Telephone Encounter (Signed)
Call placed to patient to review benefits for scheduled ultrasound. Left voicemail message requesting a return call °

## 2017-08-19 NOTE — Telephone Encounter (Signed)
Spoke with patient regarding benefit for scheduled ultrasound. Patient understood and agreeable. Patient scheduled 07/23/17 with Dr Talbert Nan. Patient aware of appointment date, arrival time and cancellation policy. No further questions. Ok to close

## 2017-08-19 NOTE — Telephone Encounter (Signed)
Patient called and spoke with Dr Talbert Nan. Appointments scheduled per patient request based on work schedule.  Encounter closed.

## 2017-08-19 NOTE — Telephone Encounter (Signed)
Called patient, per DPR detailed message left. I told her that I spoke with Dr Sabra Heck about her visit yesterday in the MAU. I recommended that she get another BhcG tomorrow and that it is up to her about getting an ultrasound tomorrow. If she isn't having worsening pain or heavy bleeding, I don't thinks she has to have an ultrasound tomorrow, but can if she prefers to. Other option is to get her BhcG tomorrow and a BhcG on Thursday and plan an ultrasound here on Thursday. I asked her to call back and speak with myself or Gay Filler.  CC: Lamont Snowball, RN

## 2017-08-20 ENCOUNTER — Encounter (HOSPITAL_COMMUNITY)
Admission: AD | Disposition: A | Discharge status: Home / Self Care | Payer: Self-pay | Source: Ambulatory Visit | Attending: Obstetrics and Gynecology

## 2017-08-20 ENCOUNTER — Inpatient Hospital Stay (HOSPITAL_COMMUNITY): Payer: 59 | Admitting: Anesthesiology

## 2017-08-20 ENCOUNTER — Telehealth: Payer: Self-pay | Admitting: *Deleted

## 2017-08-20 ENCOUNTER — Ambulatory Visit (HOSPITAL_COMMUNITY)
Admission: AD | Admit: 2017-08-20 | Discharge: 2017-08-20 | Disposition: A | Discharge status: Home / Self Care | Payer: 59 | Source: Ambulatory Visit | Attending: Obstetrics and Gynecology | Admitting: Obstetrics and Gynecology

## 2017-08-20 ENCOUNTER — Other Ambulatory Visit: Payer: Self-pay | Admitting: Obstetrics and Gynecology

## 2017-08-20 ENCOUNTER — Other Ambulatory Visit (INDEPENDENT_AMBULATORY_CARE_PROVIDER_SITE_OTHER): Payer: 59

## 2017-08-20 ENCOUNTER — Encounter (HOSPITAL_COMMUNITY): Payer: Self-pay

## 2017-08-20 ENCOUNTER — Other Ambulatory Visit: Payer: Self-pay

## 2017-08-20 ENCOUNTER — Inpatient Hospital Stay (HOSPITAL_COMMUNITY): Payer: 59

## 2017-08-20 DIAGNOSIS — N803 Endometriosis of pelvic peritoneum: Secondary | ICD-10-CM | POA: Diagnosis not present

## 2017-08-20 DIAGNOSIS — N838 Other noninflammatory disorders of ovary, fallopian tube and broad ligament: Secondary | ICD-10-CM | POA: Insufficient documentation

## 2017-08-20 DIAGNOSIS — F419 Anxiety disorder, unspecified: Secondary | ICD-10-CM | POA: Diagnosis not present

## 2017-08-20 DIAGNOSIS — O209 Hemorrhage in early pregnancy, unspecified: Secondary | ICD-10-CM

## 2017-08-20 DIAGNOSIS — N898 Other specified noninflammatory disorders of vagina: Secondary | ICD-10-CM | POA: Diagnosis not present

## 2017-08-20 DIAGNOSIS — N809 Endometriosis, unspecified: Secondary | ICD-10-CM | POA: Diagnosis not present

## 2017-08-20 DIAGNOSIS — K661 Hemoperitoneum: Secondary | ICD-10-CM | POA: Insufficient documentation

## 2017-08-20 DIAGNOSIS — N83201 Unspecified ovarian cyst, right side: Secondary | ICD-10-CM | POA: Insufficient documentation

## 2017-08-20 DIAGNOSIS — R102 Pelvic and perineal pain: Secondary | ICD-10-CM | POA: Diagnosis not present

## 2017-08-20 DIAGNOSIS — O00102 Left tubal pregnancy without intrauterine pregnancy: Secondary | ICD-10-CM | POA: Diagnosis not present

## 2017-08-20 DIAGNOSIS — O00112 Left tubal pregnancy with intrauterine pregnancy: Secondary | ICD-10-CM | POA: Diagnosis not present

## 2017-08-20 HISTORY — PX: LAPAROSCOPY: SHX197

## 2017-08-20 LAB — CBC WITH DIFFERENTIAL/PLATELET
BASOS ABS: 0 10*3/uL (ref 0.0–0.1)
Basophils Relative: 0 %
EOS ABS: 0.1 10*3/uL (ref 0.0–0.7)
Eosinophils Relative: 1 %
HEMATOCRIT: 38.6 % (ref 36.0–46.0)
Hemoglobin: 13.2 g/dL (ref 12.0–15.0)
Lymphocytes Relative: 22 %
Lymphs Abs: 2.4 10*3/uL (ref 0.7–4.0)
MCH: 30.3 pg (ref 26.0–34.0)
MCHC: 34.2 g/dL (ref 30.0–36.0)
MCV: 88.7 fL (ref 78.0–100.0)
MONO ABS: 0.6 10*3/uL (ref 0.1–1.0)
Monocytes Relative: 6 %
NEUTROS ABS: 7.7 10*3/uL (ref 1.7–7.7)
NEUTROS PCT: 71 %
Platelets: 338 10*3/uL (ref 150–400)
RBC: 4.35 MIL/uL (ref 3.87–5.11)
RDW: 13.1 % (ref 11.5–15.5)
WBC: 10.8 10*3/uL — ABNORMAL HIGH (ref 4.0–10.5)

## 2017-08-20 LAB — CREATININE, SERUM
CREATININE: 0.63 mg/dL (ref 0.44–1.00)
GFR calc non Af Amer: >60 mL/min (ref >60)

## 2017-08-20 LAB — HCG, QUANTITATIVE, PREGNANCY: hCG, Beta Chain, Quant, S: 1054 m[IU]/mL — ABNORMAL HIGH (ref <5)

## 2017-08-20 LAB — BUN: BUN: 12 mg/dL (ref 6–20)

## 2017-08-20 LAB — BETA HCG QUANT (REF LAB): hCG Quant: 983 m[IU]/mL

## 2017-08-20 LAB — AST: AST: 18 U/L (ref 15–41)

## 2017-08-20 SURGERY — LAPAROSCOPY OPERATIVE
Anesthesia: General | Site: Abdomen | Laterality: Left

## 2017-08-20 MED ORDER — HYDROCODONE-ACETAMINOPHEN 5-325 MG PO TABS: 1.0000 | ORAL_TABLET | ORAL | 0 refills | Status: DC | PRN

## 2017-08-20 MED ORDER — SODIUM CHLORIDE 0.9 % IJ SOLN
INTRAMUSCULAR | Status: DC | PRN
  Administered 2017-08-20: 3 mL

## 2017-08-20 MED ORDER — SCOPOLAMINE 1 MG/3DAYS TD PT72
MEDICATED_PATCH | TRANSDERMAL | Status: DC | PRN
  Administered 2017-08-20: 1 via TRANSDERMAL

## 2017-08-20 MED ORDER — MIDAZOLAM HCL 2 MG/2ML IJ SOLN
INTRAMUSCULAR | Status: AC
  Filled 2017-08-20: qty 2

## 2017-08-20 MED ORDER — NEOSTIGMINE METHYLSULFATE 10 MG/10ML IV SOLN
INTRAVENOUS | Status: DC | PRN
Start: 2017-08-20 — End: 2017-08-20
  Administered 2017-08-20: 1.5 mg via INTRAVENOUS

## 2017-08-20 MED ORDER — PROPOFOL 10 MG/ML IV BOLUS
INTRAVENOUS | Status: AC
  Filled 2017-08-20: qty 20

## 2017-08-20 MED ORDER — MEPERIDINE HCL 25 MG/ML IJ SOLN: 6.2500 mg | INTRAMUSCULAR | Status: DC | PRN

## 2017-08-20 MED ORDER — IBUPROFEN 800 MG PO TABS: 800.0000 mg | ORAL_TABLET | Freq: Three times a day (TID) | ORAL | 0 refills | Status: DC | PRN

## 2017-08-20 MED ORDER — LIDOCAINE HCL (CARDIAC) PF 100 MG/5ML IV SOSY
PREFILLED_SYRINGE | INTRAVENOUS | Status: DC | PRN
  Administered 2017-08-20: 40 mg via INTRAVENOUS

## 2017-08-20 MED ORDER — ONDANSETRON HCL 4 MG PO TABS: 4.0000 mg | ORAL_TABLET | Freq: Three times a day (TID) | ORAL | 1 refills | Status: DC | PRN

## 2017-08-20 MED ORDER — SODIUM CHLORIDE 0.9 % IR SOLN
Status: DC | PRN
  Administered 2017-08-20: 3000 mL

## 2017-08-20 MED ORDER — ONDANSETRON HCL 4 MG/2ML IJ SOLN
INTRAMUSCULAR | Status: DC | PRN
  Administered 2017-08-20: 4 mg via INTRAVENOUS

## 2017-08-20 MED ORDER — FENTANYL CITRATE (PF) 100 MCG/2ML IJ SOLN
INTRAMUSCULAR | Status: AC
Start: 2017-08-20 — End: ?
  Filled 2017-08-20: qty 2

## 2017-08-20 MED ORDER — HYDROCODONE-ACETAMINOPHEN 5-325 MG PO TABS
1.0000 | ORAL_TABLET | Freq: Once | ORAL | Status: AC
  Administered 2017-08-20: 1 via ORAL

## 2017-08-20 MED ORDER — OXYCODONE HCL 5 MG/5ML PO SOLN: 5.0000 mg | Freq: Once | ORAL | Status: DC | PRN

## 2017-08-20 MED ORDER — MIDAZOLAM HCL 2 MG/2ML IJ SOLN
INTRAMUSCULAR | Status: DC | PRN
  Administered 2017-08-20: 1 mg via INTRAVENOUS

## 2017-08-20 MED ORDER — PHENYLEPHRINE HCL 10 MG/ML IJ SOLN
INTRAMUSCULAR | Status: DC | PRN
  Administered 2017-08-20: .04 mg via INTRAVENOUS
  Administered 2017-08-20: 0.1 mg via INTRAVENOUS
  Administered 2017-08-20: .04 mg via INTRAVENOUS
  Administered 2017-08-20: .08 mg via INTRAVENOUS

## 2017-08-20 MED ORDER — FENTANYL CITRATE (PF) 100 MCG/2ML IJ SOLN
INTRAMUSCULAR | Status: DC | PRN
  Administered 2017-08-20 (×2): 25 ug via INTRAVENOUS
  Administered 2017-08-20: 50 ug via INTRAVENOUS

## 2017-08-20 MED ORDER — PROPOFOL 10 MG/ML IV BOLUS
INTRAVENOUS | Status: DC | PRN
  Administered 2017-08-20 (×2): 20 mg via INTRAVENOUS
  Administered 2017-08-20: 150 mg via INTRAVENOUS

## 2017-08-20 MED ORDER — VASOPRESSIN 20 UNIT/ML IV SOLN
INTRAVENOUS | Status: AC
  Filled 2017-08-20: qty 1

## 2017-08-20 MED ORDER — SUCCINYLCHOLINE CHLORIDE 20 MG/ML IJ SOLN
INTRAMUSCULAR | Status: DC | PRN
  Administered 2017-08-20: 60 mg via INTRAVENOUS

## 2017-08-20 MED ORDER — DEXAMETHASONE SODIUM PHOSPHATE 10 MG/ML IJ SOLN
INTRAMUSCULAR | Status: DC | PRN
  Administered 2017-08-20: 10 mg via INTRAVENOUS

## 2017-08-20 MED ORDER — OXYCODONE HCL 5 MG PO TABS: 5.0000 mg | ORAL_TABLET | Freq: Once | ORAL | Status: DC | PRN

## 2017-08-20 MED ORDER — PROMETHAZINE HCL 25 MG/ML IJ SOLN
6.2500 mg | INTRAMUSCULAR | Status: DC | PRN
Start: 2017-08-20 — End: 2017-08-20

## 2017-08-20 MED ORDER — LACTATED RINGERS IV SOLN
INTRAVENOUS | Status: DC | PRN
  Administered 2017-08-20 (×2): via INTRAVENOUS

## 2017-08-20 MED ORDER — BUPIVACAINE HCL (PF) 0.25 % IJ SOLN
INTRAMUSCULAR | Status: DC | PRN
  Administered 2017-08-20: 6 mL

## 2017-08-20 MED ORDER — HYDROMORPHONE HCL 1 MG/ML IJ SOLN: 0.2500 mg | INTRAMUSCULAR | Status: DC | PRN

## 2017-08-20 MED ORDER — GLYCOPYRROLATE 0.2 MG/ML IJ SOLN
INTRAMUSCULAR | Status: DC | PRN
  Administered 2017-08-20: 0.2 mg via INTRAVENOUS

## 2017-08-20 MED ORDER — SODIUM CHLORIDE 0.9 % IJ SOLN
INTRAMUSCULAR | Status: AC
  Filled 2017-08-20: qty 100

## 2017-08-20 MED ORDER — ROCURONIUM BROMIDE 100 MG/10ML IV SOLN
INTRAVENOUS | Status: DC | PRN
  Administered 2017-08-20: 15 mg via INTRAVENOUS

## 2017-08-20 SURGICAL SUPPLY — 25 items
CANISTER SUCT 3000ML PPV (MISCELLANEOUS) ×3 IMPLANT
DERMABOND ADVANCED (GAUZE/BANDAGES/DRESSINGS) ×2
DERMABOND ADVANCED .7 DNX12 (GAUZE/BANDAGES/DRESSINGS) ×1 IMPLANT
DRSG OPSITE POSTOP 3X4 (GAUZE/BANDAGES/DRESSINGS) ×6 IMPLANT
DURAPREP 26ML APPLICATOR (WOUND CARE) ×3 IMPLANT
GLOVE BIOGEL PI IND STRL 7.0 (GLOVE) ×4 IMPLANT
GLOVE BIOGEL PI INDICATOR 7.0 (GLOVE) ×8
GLOVE ECLIPSE 6.5 STRL STRAW (GLOVE) ×6 IMPLANT
GOWN STRL REUS W/TWL LRG LVL3 (GOWN DISPOSABLE) ×9 IMPLANT
LIGASURE VESSEL 5MM BLUNT TIP (ELECTROSURGICAL) ×3 IMPLANT
NEEDLE INSUFFLATION 120MM (ENDOMECHANICALS) ×3 IMPLANT
NS IRRIG 1000ML POUR BTL (IV SOLUTION) ×3 IMPLANT
PACK LAPAROSCOPY BASIN (CUSTOM PROCEDURE TRAY) ×3 IMPLANT
PACK TRENDGUARD 450 HYBRID PRO (MISCELLANEOUS) ×1 IMPLANT
PROTECTOR NERVE ULNAR (MISCELLANEOUS) ×6 IMPLANT
SET IRRIG TUBING LAPAROSCOPIC (IRRIGATION / IRRIGATOR) ×3 IMPLANT
SUT VICRYL 0 UR6 27IN ABS (SUTURE) ×3 IMPLANT
SUT VICRYL 4-0 PS2 18IN ABS (SUTURE) ×3 IMPLANT
SYSTEM CARTER THOMASON II (TROCAR) ×3 IMPLANT
TOWEL OR 17X24 6PK STRL BLUE (TOWEL DISPOSABLE) ×6 IMPLANT
TRAY FOLEY W/BAG SLVR 14FR (SET/KITS/TRAYS/PACK) ×3 IMPLANT
TRENDGUARD 450 HYBRID PRO PACK (MISCELLANEOUS) ×3
TROCAR XCEL NON-BLD 11X100MML (ENDOMECHANICALS) ×3 IMPLANT
TROCAR XCEL NON-BLD 5MMX100MML (ENDOMECHANICALS) ×6 IMPLANT
WARMER LAPAROSCOPE (MISCELLANEOUS) ×3 IMPLANT

## 2017-08-20 NOTE — Progress Notes (Signed)
Orders for Methotrexate placed. She received rhogam last week.

## 2017-08-20 NOTE — Transfer of Care (Signed)
Immediate Anesthesia Transfer of Care Note  Patient: Kara Landry  Procedure(s) Performed: LAPAROSCOPY OPERATIVE with treatment of left ectopic pregnancy (Left Abdomen)  Patient Location: PACU  Anesthesia Type:General  Level of Consciousness: awake, alert , oriented and patient cooperative  Airway & Oxygen Therapy: Patient Spontanous Breathing and Patient connected to face mask oxygen  Post-op Assessment: Report given to RN and Post -op Vital signs reviewed and stable  Post vital signs: Reviewed and stable  Last Vitals:  Vitals Value Taken Time  BP    Temp    Pulse 145 08/20/2017  7:24 PM  Resp 17 08/20/2017  7:24 PM  SpO2 100 % 08/20/2017  7:24 PM  Vitals shown include unvalidated device data.  Last Pain:  Vitals:   08/20/17 1532  TempSrc: Oral  PainSc:          Complications: No apparent anesthesia complications

## 2017-08-20 NOTE — MAU Note (Signed)
Pt. Was seen today in office.  Pt. States pain has worsened and vaginal bleeding has increased since her visit last week.

## 2017-08-20 NOTE — Telephone Encounter (Signed)
Called patient to review, her results. Not c/w with a viable pregnancy. No bleeding. Mild cramping.  Discussed her options of methotrexate, D&C (in office or hospital) with possible need for methotrexate after that or rechecking her BhcG in 24-48 hours to see if it is dropping. Discussed that delaying treatment would increase her risk of ectopic rupture if she did have an ectopic. She wants to discuss the options with her husband and call back.   She will call back and speak with myself or one of the phone nurses.

## 2017-08-20 NOTE — Op Note (Signed)
Preoperative Diagnosis: Left ectopic pregnancy, hemoperitoneum, right ovarian cyst  Postoperative Diagnosis: Left ectopic pregnancy, hemoperitoneum, right ovarian cyst, endometriosis, hymenal band   Procedure:  Diagnostic laparoscopy, left salpingectomy  Surgeon: Dr Sumner Boast  Assistant: Dr Edwinna Areola  Anesthesia: General  EBL: 100 cc  Fluids: 1,500 cc LR  Urine output: 627 cc  Complications: none  Indications for surgery: The patient is a 27 year old female, who presented with abnormal bleeding and a positive UPT. She was followed with serial BhcG's, and ultrasounds. Last week she was diagnosed with a right hemorrhagic cyst. Today her BhcG plateaued. After discussing the options, the decision was made to proceed with methotrexate. When arriving at the hospital she developed severe LLQ abdominal pain. Ultrasound was concerning for a left ectopic with hemoperitoneum. The decision was then made to proceed with laparoscopy. The patient is aware of the risks and complications involved with the surgery and consent was obtained prior to the procedure.  Findings: left ectopic pregnancy, involving the majority of the tube, there was blood and clot in her abdomen and clot coming out of her left tube. She had a normal left ovary, a normal right tube, and a cyst noted on her otherwise normal right ovary. She was noted to have endometriosis only in the posterior cul de sac near the rectum.  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 She was placed in the dorsal lithotomy position. General anesthesia was administered. She was prepped and draped in the usual sterile fashion for an abdominal, vaginal surgery. A hulka uterine manipulator was placed. A foley catheter was placed.   The umbilicus was everted, injected with 0.25% marcaine and incised with a # 11 blade. 2  towel clips were used to elevated the umbilicus and a veress needle was placed into the abdominal cavity. The abdominal cavity was insufflated with CO2, with normal intraabdominal pressures. After adequate pneumo-insufflation the veress needle was removed and the 5 mm laparoscope was placed into the abdominal cavity using the opti-view trocar. The upper abdomen was visualized, there was a small amount of blood around her liver. The patient was then placed in trendelenburg and the abdominal pelvic cavity was inspected. 2 more trocars were placed. 1 in either lower quadrant approximately 3 cm medial to the anterior superior iliac spine. These areas were injected with 0.25% marcaine, incised with a #11 blades. A 5 mm trocar was inserted in the left lower quadrant and an 11 mm trocar in the right. Both were inserted with direct visualization with the laparoscope. The abdominal pelvic cavity was again inspected.   The left tube was elevated off of the ovary and the mesosalpinx was cauterized and cut with the ligasure device. The tube was cauterized and cut at the cornual region, separating it from the uterus. The tube was removed through the 11 mm trocar. The abdomen was suction of blood, the clots were removed. Hemostasis was excellent.   The abdominal pelvic cavity was irrigated and suctioned dry. Pressure was released and hemostasis remained excellent.   The 11 mm midline trocar was removed and the [employer] device was used to place a stich of 0-Vicryl through the fascia. The abdominal cavity was desufflated and the trocars were removed. The skin was closed with subcuticular stiches of 4-0 vicryl and dermabond was placed over the incisions.  The foley catheter and  hulka tenaculum were removed.   The patient's abdomen and perineum were cleansed and she was taken out of the dorsal lithotomy position. Upon awakening she was extubated and taken to the recovery room in stable condition. The sponge and  instrument counts were correct.

## 2017-08-20 NOTE — MAU Note (Signed)
Has been being followed at office and MAU with repeat hormone levels.  Desired preg. Dx with hemorrhagic ovarian cysts. Initially had some spotting. HCG level has leveled off, sent in today for MTX.  Lab work has been drawn, noted gush of blood when used the restroom now and has started cramping.

## 2017-08-20 NOTE — H&P (Signed)
PAMILA MENDIBLES is an 27 y.o. female. She presents with abnormally rising BhcG's. No ectopic risk factors.  She was initially seen last week with [facility] and had a + UPT. BhcG's have been rising slowly since then and today's level was essentially unchanged. Last week she had an ultrasound for pain and was noted to have a right hemorrhagic cyst. Her pain improved until she arrived in the [facility] parking lot. She now c/o a constant pain in her LLQ, ranging from a 6-8/10 in severity.      08/14/17  08/15/17 08/16/17 08/18/17 08/20/17  [facility]   430  527  668  988  983 (office), 1,054 (hospital)   Pertinent Gynecological History: Sexually transmitted diseases: no past history Previous GYN Procedures: none  OB History: G1, P0   Menstrual History: Cycles are monthly x 5 days. Last cycle came on time, 08/01/17, but was irregular, starting and stopping.     Past Medical History:  Diagnosis Date  . Anxiety   . Headache   . History of chicken pox 1996  . Infection    UTI  . Medical history non-contributory     Past Surgical History:  Procedure Laterality Date  . NO PAST SURGERIES    . WISDOM TOOTH EXTRACTION      Family History  Problem Relation Age of Onset  . Depression Other   . Osteoporosis Other   . Breast cancer Other        paternal great aunt - breast cancer  . Heart disease Other   . Depression Paternal Aunt   . Cancer Paternal Aunt 79       paternal aunt - uterine cancer  . Cancer Maternal Aunt 8       maternal aunt - ovarian cancer  . Osteoporosis Mother   . Heart disease Maternal Grandfather     Social History:  reports that she has never smoked. She has never used smokeless tobacco. She reports that she does not drink alcohol or use drugs.  Allergies: No Known Allergies  No medications prior to admission.    ROS  Blood pressure 105/72, pulse (!) 113, temperature 97.9 F (36.6 C), temperature source Oral, resp. rate 20, height 5' 2.5" (1.588 m), weight 95 lb 4 oz (43.2 kg),  last menstrual period 07/03/2017, SpO2 100 %. Physical Exam  Heart: tachycardic, regular rhythm Lungs: CTAB Abdomen: soft, mild diffuse tenderness, no rebound, no guarding, no masses,  no organomegaly, mildly distended Extremities: normal, atraumatic, no cyanosis Skin: normal color, texture and turgor, no rashes or lesions Lymph: normal cervical supraclavicular and inguinal nodes Neurologic: grossly normal    Results for orders placed or performed during the hospital encounter of 08/20/17 (from the past 24 hour(s))  AST     Status: None   Collection Time: 08/20/17  2:52 PM  Result Value Ref Range   AST 18 15 - 41 U/L  BUN     Status: None   Collection Time: 08/20/17  2:52 PM  Result Value Ref Range   BUN 12 6 - 20 mg/dL  CBC WITH DIFFERENTIAL     Status: Abnormal   Collection Time: 08/20/17  2:52 PM  Result Value Ref Range   WBC 10.8 (H) 4.0 - 10.5 K/uL   RBC 4.35 3.87 - 5.11 MIL/uL   Hemoglobin 13.2 12.0 - 15.0 g/dL   HCT 38.6 36.0 - 46.0 %   MCV 88.7 78.0 - 100.0 fL   MCH 30.3 26.0 - 34.0 pg  MCHC 34.2 30.0 - 36.0 g/dL   RDW 13.1 11.5 - 15.5 %   Platelets 338 150 - 400 K/uL   Neutrophils Relative % 71 %   Neutro Abs 7.7 1.7 - 7.7 K/uL   Lymphocytes Relative 22 %   Lymphs Abs 2.4 0.7 - 4.0 K/uL   Monocytes Relative 6 %   Monocytes Absolute 0.6 0.1 - 1.0 K/uL   Eosinophils Relative 1 %   Eosinophils Absolute 0.1 0.0 - 0.7 K/uL   Basophils Relative 0 %   Basophils Absolute 0.0 0.0 - 0.1 K/uL  Creatinine, serum     Status: None   Collection Time: 08/20/17  2:52 PM  Result Value Ref Range   Creatinine, Ser 0.63 0.44 - 1.00 mg/dL   GFR calc non Af Amer >60 >60 mL/min   GFR calc Af Amer >60 >60 mL/min  hCG, quantitative, pregnancy     Status: Abnormal   Collection Time: 08/20/17  2:52 PM  Result Value Ref Range   hCG, Beta Chain, Quant, S 1,054 (H) <5 mIU/mL   A negative, s/p rhogam last week  US Ob Transvaginal  Result Date: 08/20/2017 CLINICAL DATA:   27 year old pregnant female presents with left pelvic pain and bleeding. Pregnancy of uncertain location on recent obstetric scans. EDC by LMP: 04/09/2018, projecting to an expected gestational age of [redacted] weeks 6 days. EXAM: TRANSVAGINAL OB ULTRASOUND TECHNIQUE: Transvaginal ultrasound was performed for complete evaluation of the gestation as well as the maternal uterus, adnexal regions, and pelvic cul-de-sac. COMPARISON:  08/18/2017 and 08/15/2017 obstetric scans. FINDINGS: Anteverted uterus is normal in size and configuration, with no uterine fibroids or other myometrial abnormalities. No intrauterine gestational sac. Bilayer endometrial thickness 6 mm. No endometrial cavity fluid or focal endometrial mass. Right ovary measures 5.3 x 4.3 x 5.0 cm and contains a 4.3 x 3.6 x 4.1 cm complex cystic mass with heterogeneous internal echoes and no internal vascularity, compatible with a right ovarian hemorrhagic cyst, which measured 4.1 x 3.9 cm on 08/18/2017, not appreciably changed. No additional right ovarian or right adnexal masses. Left ovary measures 2.9 x 1.0 x 2.3 cm. Separate from the left ovary, and located between the left ovary and uterus, there is a new mixed echogenicity 3.8 x 1.7 x 3.4 cm left adnexal mass with some internal vascularity on color Doppler. No discrete gestational sac, embryo or embryonic cardiac activity are demonstrated within this left adnexal mass. There is new moderate volume free fluid in the pelvic cul-de-sac and left adnexa with low level internal echoes, compatible with hemoperitoneum. IMPRESSION: 1. New mixed echogenicity 3.8 x 1.7 x 3.4 cm left adnexal mass located between the left ovary and uterus, separate from the left ovary, most compatible with left tubal ectopic gestation. 2. New moderate volume hemoperitoneum in the pelvic cul-de-sac and left adnexa, cannot exclude left tubal rupture. 3. No intrauterine gestation. 4. Stable 4.3 cm right ovarian hemorrhagic cyst. These results  were called by telephone at the time of interpretation on 08/20/2017 at 4:51 pm to Dr. Sumner Boast , who verbally acknowledged these results. Electronically Signed   By: Ilona Sorrel M.D.   On: 08/20/2017 16:58    Assessment/Plan: 27 yo G1P0 with left ectopic pregnancy, signs of rupture on ultrasound Plan: diagnostic laparoscopy, left salpingostomy vs left salpingectomy Reviewed risks, including: bleeding, infection, damage to nearby organs. Need for other surgery.  Consent was signed.   Francesca Jewett [facility] 08/20/2017, 5:24 PM

## 2017-08-20 NOTE — Discharge Instructions (Signed)
Remove dressing in 2 days. Please remove nausea patch behind ear within 3 days. Wash Hands!!     Diagnostic Laparoscopy, Care After Refer to this sheet in the next few weeks. These instructions provide you with information about caring for yourself after your procedure. Your health care provider may also give you more specific instructions. Your treatment has been planned according to current medical practices, but problems sometimes occur. Call your health care provider if you have any problems or questions after your procedure. What can I expect after the procedure? After your procedure, it is common to have mild discomfort in the throat and abdomen. Follow these instructions at home:  Take over-the-counter and prescription medicines only as told by your health care provider.  Do not drive for 24 hours if you received a sedative.  Return to your normal activities as told by your health care provider.  Do not take baths, swim, or use a hot tub until your health care provider approves. You may shower.  Follow instructions from your health care provider about how to take care of your incision. Make sure you: ? Wash your hands with soap and water before you change your bandage (dressing). If soap and water are not available, use hand sanitizer. ? Change your dressing as told by your health care provider. ? Leave stitches (sutures), skin glue, or adhesive strips in place. These skin closures may need to stay in place for 2 weeks or longer. If adhesive strip edges start to loosen and curl up, you may trim the loose edges. Do not remove adhesive strips completely unless your health care provider tells you to do that.  Check your incision area every day for signs of infection. Check for: ? More redness, swelling, or pain. ? More fluid or blood. ? Warmth. ? Pus or a bad smell.  It is your responsibility to get the results of your procedure. Ask your health care provider or the department  performing the procedure when your results will be ready. Contact a health care provider if:  There is new pain in your shoulders.  You feel light-headed or faint.  You are unable to pass gas or unable to have a bowel movement.  You feel nauseous or you vomit.  You develop a rash.  You have more redness, swelling, or pain around your incision.  You have more fluid or blood coming from your incision.  Your incision feels warm to the touch.  You have pus or a bad smell coming from your incision.  You have a fever or chills. Get help right away if:  Your pain is getting worse.  You have ongoing vomiting.  The edges of your incision open up.  You have trouble breathing.  You have chest pain. This information is not intended to replace advice given to you by your health care provider. Make sure you discuss any questions you have with your health care provider. Document Released: 02/28/2015 Document Revised: 08/25/2015 Document Reviewed: 11/30/2014 Elsevier Interactive Patient Education  2018 [place] Anesthesia, Adult, Care After These instructions provide you with information about caring for yourself after your procedure. Your health care provider may also give you more specific instructions. Your treatment has been planned according to current medical practices, but problems sometimes occur. Call your health care provider if you have any problems or questions after your procedure. What can I expect after the procedure? After the procedure, it is common to have:  Vomiting.  A sore throat.  Mental slowness.  It is common to feel:  Nauseous.  Cold or shivery.  Sleepy.  Tired.  Sore or achy, even in parts of your body where you did not have surgery.  Follow these instructions at home: For at least 24 hours after the procedure:  Do not: ? Participate in activities where you could fall or become injured. ? Drive. ? Use heavy machinery. ? Drink  alcohol. ? Take sleeping pills or medicines that cause drowsiness. ? Make important decisions or sign legal documents. ? Take care of children on your own.  Rest. Eating and drinking  If you vomit, drink water, juice, or soup when you can drink without vomiting.  Drink enough fluid to keep your urine clear or pale yellow.  Make sure you have little or no nausea before eating solid foods.  Follow the diet recommended by your health care provider. General instructions  Have a responsible adult stay with you until you are awake and alert.  Return to your normal activities as told by your health care provider. Ask your health care provider what activities are safe for you.  Take over-the-counter and prescription medicines only as told by your health care provider.  If you smoke, do not smoke without supervision.  Keep all follow-up visits as told by your health care provider. This is important. Contact a health care provider if:  You continue to have nausea or vomiting at home, and medicines are not helpful.  You cannot drink fluids or start eating again.  You cannot urinate after 8-12 hours.  You develop a skin rash.  You have fever.  You have increasing redness at the site of your procedure. Get help right away if:  You have difficulty breathing.  You have chest pain.  You have unexpected bleeding.  You feel that you are having a life-threatening or urgent problem. This information is not intended to replace advice given to you by your health care provider. Make sure you discuss any questions you have with your health care provider. Document Released: 06/25/2000 Document Revised: 08/22/2015 Document Reviewed: 03/03/2015 Elsevier Interactive Patient Education  [employer].

## 2017-08-20 NOTE — Anesthesia Procedure Notes (Signed)
Procedure Name: Intubation Date/Time: 08/20/2017 6:15 PM Performed by: Adalberto Ill, CRNA Pre-anesthesia Checklist: Patient identified, Emergency Drugs available, Suction available, Patient being monitored and Timeout performed Patient Re-evaluated:Patient Re-evaluated prior to induction Oxygen Delivery Method: Circle system utilized Preoxygenation: Pre-oxygenation with 100% oxygen Induction Type: IV induction Laryngoscope Size: Miller and 2 Grade View: Grade I Tube type: Oral Tube size: 7.0 mm Number of attempts: 1 (brief atraumatic ) Airway Equipment and Method: Stylet Placement Confirmation: ETT inserted through vocal cords under direct vision,  positive ETCO2 and breath sounds checked- equal and bilateral Secured at: 22 cm Tube secured with: Tape Dental Injury: Teeth and Oropharynx as per pre-operative assessment

## 2017-08-20 NOTE — Anesthesia Postprocedure Evaluation (Signed)
Anesthesia Post Note  Patient: Kara Landry  Procedure(s) Performed: LAPAROSCOPY OPERATIVE with treatment of left ectopic pregnancy (Left Abdomen)     Patient location during evaluation: PACU Anesthesia Type: General Level of consciousness: awake and alert Pain management: pain level controlled Vital Signs Assessment: post-procedure vital signs reviewed and stable Respiratory status: spontaneous breathing, nonlabored ventilation and respiratory function stable Cardiovascular status: blood pressure returned to baseline and stable Postop Assessment: no apparent nausea or vomiting Anesthetic complications: no    Last Vitals:  Vitals:   08/20/17 2000 08/20/17 2045  BP: 112/84 112/72  Pulse: (!) 103 91  Resp: 19 16  Temp:  36.7 C  SpO2: 100% 100%    Last Pain:  Vitals:   08/20/17 2045  TempSrc:   PainSc: 6    Pain Goal:                 [name]

## 2017-08-20 NOTE — Anesthesia Preprocedure Evaluation (Signed)
Anesthesia Evaluation  Patient identified by MRN, date of birth, ID band Patient awake    Reviewed: Allergy & Precautions, NPO status , Patient's Chart, lab work & pertinent test results  Airway Mallampati: II  TM Distance: >3 FB Neck ROM: Full    Dental no notable dental hx.    Pulmonary neg pulmonary ROS,    Pulmonary exam normal breath sounds clear to auscultation       Cardiovascular negative cardio ROS Normal cardiovascular exam Rhythm:Regular Rate:Normal     Neuro/Psych  Headaches, Anxiety negative neurological ROS  negative psych ROS   GI/Hepatic negative GI ROS, Neg liver ROS,   Endo/Other  negative endocrine ROS  Renal/GU negative Renal ROS  negative genitourinary   Musculoskeletal negative musculoskeletal ROS (+)   Abdominal   Peds negative pediatric ROS (+)  Hematology negative hematology ROS (+)   Anesthesia Other Findings Ectopic Pregnancy  Reproductive/Obstetrics negative OB ROS                             Anesthesia Physical Anesthesia Plan  ASA: II and emergent  Anesthesia Plan: General   Post-op Pain Management:    Induction: Intravenous, Rapid sequence and Cricoid pressure planned  PONV Risk Score and Plan: 3 and Ondansetron, Dexamethasone and Midazolam  Airway Management Planned: Oral ETT  Additional Equipment:   Intra-op Plan:   Post-operative Plan: Extubation in OR  Informed Consent: I have reviewed the patients History and Physical, chart, labs and discussed the procedure including the risks, benefits and alternatives for the proposed anesthesia with the patient or authorized representative who has indicated his/her understanding and acceptance.   Dental advisory given  Plan Discussed with: CRNA  Anesthesia Plan Comments:         Anesthesia Quick Evaluation

## 2017-08-20 NOTE — Telephone Encounter (Signed)
Elmo Putt calling with STAT lab.  Beta Hcg: 983  Routing to Dr. Talbert Nan

## 2017-08-20 NOTE — Telephone Encounter (Signed)
Spoke with [facility]. Aware patient will be coming for methotrexate injection this afternoon around 3 pm. MAU requests order be placed.

## 2017-08-20 NOTE — Telephone Encounter (Signed)
I spoke with the patient again, reviewed her options. She would like to go ahead with the methotrexate. Reviewed the risks.  She will go to the MAU this afternoon.

## 2017-08-21 ENCOUNTER — Encounter (HOSPITAL_COMMUNITY): Payer: Self-pay | Admitting: Obstetrics and Gynecology

## 2017-08-21 ENCOUNTER — Telehealth: Payer: Self-pay | Admitting: Obstetrics and Gynecology

## 2017-08-22 ENCOUNTER — Telehealth: Payer: Self-pay | Admitting: Obstetrics and Gynecology

## 2017-08-22 ENCOUNTER — Other Ambulatory Visit: Payer: 59

## 2017-08-22 ENCOUNTER — Other Ambulatory Visit: Payer: 59 | Admitting: Obstetrics and Gynecology

## 2017-08-22 NOTE — Telephone Encounter (Signed)
Error

## 2017-08-22 NOTE — Telephone Encounter (Signed)
Spoke with patient. Patient would like to reschedule her follow up appointment. Appointment moved to 08/28/2017 at 11:30 am with Dr.Jertson. Patient is agreeable to date and time.  Routing to provider for final review. Patient agreeable to disposition. Will close encounter.

## 2017-08-22 NOTE — Telephone Encounter (Signed)
Patient needs to reschedule her 08/27/17 appointment with Dr. Talbert Nan. I could not find an opening before June 4th and she would like to be seen before that date.

## 2017-08-22 NOTE — Progress Notes (Deleted)
GYNECOLOGY  VISIT   HPI: 27 y.o.   Married  Caucasian  female   G1P0000 with Patient's last menstrual period was 07/03/2017. here for 1 week follow up.  GYNECOLOGIC HISTORY: Patient's last menstrual period was 07/03/2017. Contraception:*** Menopausal hormone therapy: n/a        OB History    Gravida  1   Para  0   Term  0   Preterm  0   AB  0   Living  0     SAB  0   TAB  0   Ectopic  0   Multiple  0   Live Births  0              Patient Active Problem List   Diagnosis Date Noted  . Rh negative status during pregnancy 08/13/2017  . Neck pain 07/15/2015  . Generalized anxiety disorder 07/08/2015  . Left breast mass 12/24/2013    Past Medical History:  Diagnosis Date  . Anxiety   . Headache   . History of chicken pox 1996  . Infection    UTI  . Medical history non-contributory     Past Surgical History:  Procedure Laterality Date  . LAPAROSCOPY Left 08/20/2017   Procedure: LAPAROSCOPY OPERATIVE with treatment of left ectopic pregnancy;  Surgeon: Salvadore Dom, MD;  Location: [facility];  Service: Gynecology;  Laterality: Left;  and removal of left paratubal cyst   . NO PAST SURGERIES    . WISDOM TOOTH EXTRACTION      Current Outpatient Medications  Medication Sig Dispense Refill  . HYDROcodone-acetaminophen (NORCO/VICODIN) 5-325 MG tablet Take 1-2 tablets by mouth every 4 (four) hours as needed for moderate pain. 20 tablet 0  . ibuprofen (ADVIL,MOTRIN) 800 MG tablet Take 1 tablet (800 mg total) by mouth every 8 (eight) hours as needed. 30 tablet 0  . ondansetron (ZOFRAN) 4 MG tablet Take 1 tablet (4 mg total) by mouth every 8 (eight) hours as needed for nausea or vomiting. 20 tablet 1   No current facility-administered medications for this visit.      ALLERGIES: Patient has no known allergies.  Family History  Problem Relation Age of Onset  . Depression Other   . Osteoporosis Other   . Breast cancer Other        paternal great aunt -  breast cancer  . Heart disease Other   . Depression Paternal Aunt   . Cancer Paternal Aunt 2       paternal aunt - uterine cancer  . Cancer Maternal Aunt 48       maternal aunt - ovarian cancer  . Osteoporosis Mother   . Heart disease Maternal Grandfather     Social History   Socioeconomic History  . Marital status: Married    Spouse name: Not on file  . Number of children: 0  . Years of education: Not on file  . Highest education level: Not on file  Occupational History  . Occupation: Ship broker    Comment: elementary education  Social Needs  . Financial resource strain: Not on file  . Food insecurity:    Worry: Not on file    Inability: Not on file  . Transportation needs:    Medical: Not on file    Non-medical: Not on file  Tobacco Use  . Smoking status: Never Smoker  . Smokeless tobacco: Never Used  Substance and Sexual Activity  . Alcohol use: No    Alcohol/week: 0.0 oz  .  Drug use: No  . Sexual activity: Yes    Partners: Male    Birth control/protection: None  Lifestyle  . Physical activity:    Days per week: Not on file    Minutes per session: Not on file  . Stress: Not on file  Relationships  . Social connections:    Talks on phone: Not on file    Gets together: Not on file    Attends religious service: Not on file    Active member of club or organization: Not on file    Attends meetings of clubs or organizations: Not on file    Relationship status: Not on file  . Intimate partner violence:    Fear of current or ex partner: Not on file    Emotionally abused: Not on file    Physically abused: Not on file    Forced sexual activity: Not on file  Other Topics Concern  . Not on file  Social History Narrative  . Not on file    ROS  PHYSICAL EXAMINATION:    LMP 07/03/2017     General appearance: alert, cooperative and appears stated age Neck: no adenopathy, supple, symmetrical, trachea midline and thyroid {CHL AMB PHY EX THYROID NORM  DEFAULT:[phone]::"normal to inspection and palpation"} Breasts: {Exam; breast:13139::"normal appearance, no masses or tenderness"} Abdomen: soft, non-tender; non distended, no masses,  no organomegaly  Pelvic: External genitalia:  no lesions              Urethra:  normal appearing urethra with no masses, tenderness or lesions              Bartholins and Skenes: normal                 Vagina: normal appearing vagina with normal color and discharge, no lesions              Cervix: {CHL AMB PHY EX CERVIX NORM DEFAULT:[phone]::"no lesions"}              Bimanual Exam:  Uterus:  {CHL AMB PHY EX UTERUS NORM DEFAULT:[phone]::"normal size, contour, position, consistency, mobility, non-tender"}              Adnexa: {CHL AMB PHY EX ADNEXA NO MASS [phone]::"no mass, fullness, tenderness"}              Rectovaginal: {yes no:[id number]}.  Confirms.              Anus:  normal sphincter tone, no lesions  Chaperone was present for exam.  ASSESSMENT     PLAN    An After Visit Summary was printed and given to the patient.  *** minutes face to face time of which over 50% was spent in counseling.

## 2017-08-27 ENCOUNTER — Ambulatory Visit: Payer: Self-pay | Admitting: Obstetrics and Gynecology

## 2017-08-28 ENCOUNTER — Other Ambulatory Visit: Payer: Self-pay

## 2017-08-28 ENCOUNTER — Ambulatory Visit (INDEPENDENT_AMBULATORY_CARE_PROVIDER_SITE_OTHER): Payer: 59 | Admitting: Obstetrics and Gynecology

## 2017-08-28 ENCOUNTER — Encounter: Payer: Self-pay | Admitting: Obstetrics and Gynecology

## 2017-08-28 VITALS — BP 122/70 | HR 88 | Resp 14 | Wt 94.0 lb

## 2017-08-28 DIAGNOSIS — Z8759 Personal history of other complications of pregnancy, childbirth and the puerperium: Secondary | ICD-10-CM

## 2017-08-28 DIAGNOSIS — Z9889 Other specified postprocedural states: Secondary | ICD-10-CM

## 2017-08-28 NOTE — Progress Notes (Signed)
GYNECOLOGY  VISIT   HPI: 27 y.o.   Married  Caucasian  female   [place] with Patient's last menstrual period was 08/23/2017.   here for follow up from laparoscopy, left salpingectomy for ectopic pregnancy. Pathology with villi in the tube.      GYNECOLOGIC HISTORY: Patient's last menstrual period was 08/23/2017. Contraception:none  Menopausal hormone therapy: none         OB History    Gravida  1   Para  0   Term  0   Preterm  0   AB  1   Living  0     SAB  0   TAB  0   Ectopic  1   Multiple  0   Live Births  0              Patient Active Problem List   Diagnosis Date Noted  . Rh negative status during pregnancy 08/13/2017  . Neck pain 07/15/2015  . Generalized anxiety disorder 07/08/2015  . Left breast mass 12/24/2013    Past Medical History:  Diagnosis Date  . Anxiety   . Headache   . History of chicken pox 1996  . Infection    UTI  . Medical history non-contributory     Past Surgical History:  Procedure Laterality Date  . LAPAROSCOPY Left 08/20/2017   Procedure: LAPAROSCOPY OPERATIVE with treatment of left ectopic pregnancy;  Surgeon: Salvadore Dom, MD;  Location: [facility];  Service: Gynecology;  Laterality: Left;  and removal of left paratubal cyst   . NO PAST SURGERIES    . WISDOM TOOTH EXTRACTION      Current Outpatient Medications  Medication Sig Dispense Refill  . ibuprofen (ADVIL,MOTRIN) 800 MG tablet Take 1 tablet (800 mg total) by mouth every 8 (eight) hours as needed. 30 tablet 0  . ondansetron (ZOFRAN) 4 MG tablet Take 1 tablet (4 mg total) by mouth every 8 (eight) hours as needed for nausea or vomiting. 20 tablet 1   No current facility-administered medications for this visit.      ALLERGIES: Patient has no known allergies.  Family History  Problem Relation Age of Onset  . Depression Other   . Osteoporosis Other   . Breast cancer Other        paternal great aunt - breast cancer  . Heart disease Other   . Depression  Paternal Aunt   . Cancer Paternal Aunt 29       paternal aunt - uterine cancer  . Cancer Maternal Aunt 58       maternal aunt - ovarian cancer  . Osteoporosis Mother   . Heart disease Maternal Grandfather     Social History   Socioeconomic History  . Marital status: Married    Spouse name: Not on file  . Number of children: 0  . Years of education: Not on file  . Highest education level: Not on file  Occupational History  . Occupation: Ship broker    Comment: elementary education  Social Needs  . Financial resource strain: Not on file  . Food insecurity:    Worry: Not on file    Inability: Not on file  . Transportation needs:    Medical: Not on file    Non-medical: Not on file  Tobacco Use  . Smoking status: Never Smoker  . Smokeless tobacco: Never Used  Substance and Sexual Activity  . Alcohol use: No    Alcohol/week: 0.0 oz  . Drug use: No  .  Sexual activity: Yes    Partners: Male    Birth control/protection: None  Lifestyle  . Physical activity:    Days per week: Not on file    Minutes per session: Not on file  . Stress: Not on file  Relationships  . Social connections:    Talks on phone: Not on file    Gets together: Not on file    Attends religious service: Not on file    Active member of club or organization: Not on file    Attends meetings of clubs or organizations: Not on file    Relationship status: Not on file  . Intimate partner violence:    Fear of current or ex partner: Not on file    Emotionally abused: Not on file    Physically abused: Not on file    Forced sexual activity: Not on file  Other Topics Concern  . Not on file  Social History Narrative  . Not on file    Review of Systems  Constitutional: Negative.   HENT: Negative.   Eyes: Negative.   Respiratory: Negative.   Cardiovascular: Negative.   Gastrointestinal: Negative.   Genitourinary: Negative.   Musculoskeletal: Negative.   Skin: Negative.   Neurological: Negative.    Endo/Heme/Allergies: Negative.   Psychiatric/Behavioral: Negative.     PHYSICAL EXAMINATION:    BP 122/70 (BP Location: Right Arm, Patient Position: Sitting, Cuff Size: Normal)   Pulse 88   Resp 14   Wt 94 lb (42.6 kg)   LMP 08/23/2017   Breastfeeding? No   BMI 16.92 kg/m     General appearance: alert, cooperative and appears stated age Abdomen: soft, appropriately tender; non distended, no masses,  no organomegaly. Incisions are healing well.    ASSESSMENT 1 week s/p laparoscopic salpingectomy for an ectopic pregnancy. Doing well    PLAN She will await a normal cycle prior to attempting pregnancy Will need early lab work and ultrasound to r/o ectopic Pictures from surgery reviewed Questions answered   An After Visit Summary was printed and given to the patient.

## 2017-09-18 ENCOUNTER — Encounter: Payer: Self-pay | Admitting: Obstetrics and Gynecology

## 2017-09-19 ENCOUNTER — Telehealth: Payer: Self-pay | Admitting: Obstetrics and Gynecology

## 2017-09-19 NOTE — Telephone Encounter (Signed)
Patient sent the following correspondence through [place]. Routing to triage to assist patient with request.  ----- Message from [place], Generic sent at 09/18/2017 4:47 PM EDT -----    I had surgery on 5/21 due to an ectopic pregnancy and had a Fallopian tube removed. I'm writing to make sure it is ok for me to ride on an airplane this upcoming Friday (6/21) to [place].     Thank you,  Kara Landry

## 2017-09-19 NOTE — Telephone Encounter (Signed)
I think it is fine for her to fly.

## 2017-09-19 NOTE — Telephone Encounter (Signed)
Patient had left salpingectomy for ectopic pregnancy 08-20-17. She is doing well and has plans to fly to [facility]. She just wanted to make sure Dr.Jertson felt this would be okay. Routed to provider

## 2017-09-19 NOTE — Telephone Encounter (Signed)
Patient notified okay to fly to [place].

## 2017-10-21 ENCOUNTER — Encounter: Payer: Self-pay | Admitting: Obstetrics and Gynecology

## 2017-10-21 ENCOUNTER — Telehealth: Payer: Self-pay | Admitting: Emergency Medicine

## 2017-10-21 NOTE — Telephone Encounter (Signed)
Return call, patient sent mychart message (below).  Denies fevers or bowel or bladder changes. Reports "it just feels tender."  She declined any earlier appointment than Wednesday. Office visit scheduled for 10/23/17 at 1600 with Dr. Talbert Nan. Patient advised to return call with any questions or concerns prior to appointment.

## 2017-10-21 NOTE — Telephone Encounter (Signed)
Called patient. Office visit scheduled. Encounter closed.

## 2017-10-21 NOTE — Telephone Encounter (Signed)
  Patient sent mychart message:       10/21/17 9:10 AM  I had surgery on 5/21 for an ectopic pregnancy. Ever since I have a had a pain under my belly button that hurts only when touched. (A pain thats in a line going down my belly from the button to ~3 inches down). 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 Is this a normal part of the healing process? Thank you!    This encounter is not signed. The conversation may still be ongoing.

## 2017-10-22 NOTE — Progress Notes (Signed)
GYNECOLOGY  VISIT   HPI: 27 y.o.   Married  Caucasian  female   G1P0010 with Patient's last menstrual period was 09/30/2017 (exact date).   here for pain near surgical site midline that is persistent since surgery on 08/20/2017 (laparoscopy, left salpingectomy for ectopic pregnancy). The pain has been present since her surgery, tender to the touch, no baseline pain. Feels like a bad bruise, not getting better. The pain is under her umbilicus and goes down slightly to her RLQ. No bulge, no bowel or bladder issue.   GYNECOLOGIC HISTORY: Patient's last menstrual period was 09/30/2017 (exact date). Contraception: None Menopausal hormone therapy: None        OB History    Gravida  1   Para  0   Term  0   Preterm  0   AB  1   Living  0     SAB  0   TAB  0   Ectopic  1   Multiple  0   Live Births  0              Patient Active Problem List   Diagnosis Date Noted  . History of ectopic pregnancy 08/28/2017  . Rh negative status during pregnancy 08/13/2017  . Neck pain 07/15/2015  . Generalized anxiety disorder 07/08/2015  . Left breast mass 12/24/2013    Past Medical History:  Diagnosis Date  . Anxiety   . Headache   . History of chicken pox 1996  . Infection    UTI  . Medical history non-contributory     Past Surgical History:  Procedure Laterality Date  . LAPAROSCOPY Left 08/20/2017   Procedure: LAPAROSCOPY OPERATIVE with treatment of left ectopic pregnancy;  Surgeon: Salvadore Dom, MD;  Location: [facility];  Service: Gynecology;  Laterality: Left;  and removal of left paratubal cyst   . NO PAST SURGERIES    . WISDOM TOOTH EXTRACTION      Current Outpatient Medications  Medication Sig Dispense Refill  . ibuprofen (ADVIL,MOTRIN) 800 MG tablet Take 1 tablet (800 mg total) by mouth every 8 (eight) hours as needed. 30 tablet 0   No current facility-administered medications for this visit.      ALLERGIES: Patient has no known allergies.  Family  History  Problem Relation Age of Onset  . Depression Other   . Osteoporosis Other   . Breast cancer Other        paternal great aunt - breast cancer  . Heart disease Other   . Depression Paternal Aunt   . Cancer Paternal Aunt 83       paternal aunt - uterine cancer  . Cancer Maternal Aunt 34       maternal aunt - ovarian cancer  . Osteoporosis Mother   . Heart disease Maternal Grandfather     Social History   Socioeconomic History  . Marital status: Married    Spouse name: Not on file  . Number of children: 0  . Years of education: Not on file  . Highest education level: Not on file  Occupational History  . Occupation: Ship broker    Comment: elementary education  Social Needs  . Financial resource strain: Not on file  . Food insecurity:    Worry: Not on file    Inability: Not on file  . Transportation needs:    Medical: Not on file    Non-medical: Not on file  Tobacco Use  . Smoking status: Never Smoker  . Smokeless  tobacco: Never Used  Substance and Sexual Activity  . Alcohol use: No    Alcohol/week: 0.0 oz  . Drug use: No  . Sexual activity: Yes    Partners: Male    Birth control/protection: None  Lifestyle  . Physical activity:    Days per week: Not on file    Minutes per session: Not on file  . Stress: Not on file  Relationships  . Social connections:    Talks on phone: Not on file    Gets together: Not on file    Attends religious service: Not on file    Active member of club or organization: Not on file    Attends meetings of clubs or organizations: Not on file    Relationship status: Not on file  . Intimate partner violence:    Fear of current or ex partner: Not on file    Emotionally abused: Not on file    Physically abused: Not on file    Forced sexual activity: Not on file  Other Topics Concern  . Not on file  Social History Narrative  . Not on file    Review of Systems  Constitutional: Negative.   HENT: Negative.   Eyes: Negative.    Respiratory: Negative.   Cardiovascular: Negative.   Gastrointestinal:       Abdominal pain near incision site  Genitourinary: Negative.   Musculoskeletal: Negative.   Skin: Negative.   Neurological: Negative.   Endo/Heme/Allergies: Negative.   Psychiatric/Behavioral: Negative.     PHYSICAL EXAMINATION:    BP 112/82 (BP Location: Right Arm, Patient Position: Sitting)   Pulse (!) 106   Wt 96 lb (43.5 kg)   LMP 09/30/2017 (Exact Date)   BMI 17.28 kg/m     General appearance: alert, cooperative and appears stated age Abdomen: soft, non-tender; non distended, no masses,  no organomegaly, not tender when touching her abdomen when lying down. No hernia. When standing she is tender over her right rectus muscle under her umbilicus  ASSESSMENT Abdominal wall tenderness only when she is standing. Normal exam other than tenderness in the right rectus muscle region when standing. No incisions near her point of pain, no hernia. Suspect muscular pain    PLAN Would avoid abdominal exercises, can try wearing a binder If her pain persists over the next month will refer to PT for evaluation   An After Visit Summary was printed and given to the patient.

## 2017-10-23 ENCOUNTER — Encounter: Payer: Self-pay | Admitting: Obstetrics and Gynecology

## 2017-10-23 ENCOUNTER — Ambulatory Visit (INDEPENDENT_AMBULATORY_CARE_PROVIDER_SITE_OTHER): Payer: 59 | Admitting: Obstetrics and Gynecology

## 2017-10-23 ENCOUNTER — Other Ambulatory Visit: Payer: Self-pay

## 2017-10-23 VITALS — BP 112/82 | HR 106 | Wt 96.0 lb

## 2017-10-23 DIAGNOSIS — R10813 Right lower quadrant abdominal tenderness: Secondary | ICD-10-CM

## 2017-10-25 NOTE — Telephone Encounter (Signed)
Has this been completed yet? Okay to close encounter if so.

## 2018-01-30 ENCOUNTER — Telehealth: Payer: Self-pay | Admitting: Obstetrics and Gynecology

## 2018-01-30 NOTE — Telephone Encounter (Signed)
Left message to call Levonia Wolfley, RN at [facility] [phone].   

## 2018-01-30 NOTE — Telephone Encounter (Signed)
Appointment Request From: Kara Landry    With Provider: Salvadore Dom, MD [facility]]    Preferred Date Range: 01/31/2018 - 02/07/2018    Preferred Times: Any time    Reason for visit: Request an Appointment    Comments:  I've been struggling with depression after my ectopic last May. 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

## 2018-02-11 NOTE — Telephone Encounter (Signed)
Spoke with patient. Patient states she has been experiencing depression since ectopic pregnancy last May, feels this is effecting her work. Denies SI or HI. Patient denies any other GYN symptoms or concerns. Patient does not want to start medication, would just like to discuss. Patient is aware referral may be made to counselor for follow-up. OV scheduled for 02/17/18 at 2:15pm.   Routing to provider for final review. Patient is agreeable to disposition. Will close encounter.

## 2018-02-11 NOTE — Telephone Encounter (Signed)
Left message to call Isam Unrein, RN at [facility] [phone].   MyChart message to patient.     

## 2018-02-17 ENCOUNTER — Ambulatory Visit: Payer: Self-pay | Admitting: Obstetrics and Gynecology

## 2018-03-19 ENCOUNTER — Encounter: Payer: Self-pay | Admitting: Obstetrics and Gynecology

## 2018-03-19 ENCOUNTER — Other Ambulatory Visit: Payer: Self-pay

## 2018-03-19 ENCOUNTER — Ambulatory Visit (INDEPENDENT_AMBULATORY_CARE_PROVIDER_SITE_OTHER): Payer: 59 | Admitting: Obstetrics and Gynecology

## 2018-03-19 VITALS — BP 122/84 | HR 96 | Wt 90.6 lb

## 2018-03-19 DIAGNOSIS — N912 Amenorrhea, unspecified: Secondary | ICD-10-CM

## 2018-03-19 DIAGNOSIS — Z8759 Personal history of other complications of pregnancy, childbirth and the puerperium: Secondary | ICD-10-CM | POA: Diagnosis not present

## 2018-03-19 DIAGNOSIS — Z3201 Encounter for pregnancy test, result positive: Secondary | ICD-10-CM

## 2018-03-19 LAB — POCT URINE PREGNANCY: PREG TEST UR: POSITIVE — AB

## 2018-03-19 LAB — BETA HCG QUANT (REF LAB): HCG QUANT: 113 m[IU]/mL

## 2018-03-19 NOTE — Progress Notes (Signed)
GYNECOLOGY  VISIT   HPI: 27 y.o.   Married White or Caucasian Not Hispanic or Latino  female   G1P0010 with Patient's last menstrual period was 02/19/2018 (exact date).   here for pregnancy confirmation.  Patient had a laparoscopy and left salpingectomy for an ectopic pregnancy in 07/2017. She had a normal right tube at the time of her surgery.  Since the surgery her cycles have been every 30 days. + ovulation on her OPK on 03/06/18. No bleeding, only mild intermittent cramping. She is having breast tenderness. No nausea yet.   GYNECOLOGIC HISTORY: Patient's last menstrual period was 02/19/2018 (exact date). Contraception: None Menopausal hormone therapy: None        OB History    Gravida  1   Para  0   Term  0   Preterm  0   AB  1   Living  0     SAB  0   TAB  0   Ectopic  1   Multiple  0   Live Births  0              Patient Active Problem List   Diagnosis Date Noted  . History of ectopic pregnancy 08/28/2017  . Rh negative status during pregnancy 08/13/2017  . Neck pain 07/15/2015  . Generalized anxiety disorder 07/08/2015  . Left breast mass 12/24/2013    Past Medical History:  Diagnosis Date  . Anxiety   . Headache   . History of chicken pox 1996  . Infection    UTI  . Medical history non-contributory     Past Surgical History:  Procedure Laterality Date  . LAPAROSCOPY Left 08/20/2017   Procedure: LAPAROSCOPY OPERATIVE with treatment of left ectopic pregnancy;  Surgeon: Salvadore Dom, MD;  Location: [facility];  Service: Gynecology;  Laterality: Left;  and removal of left paratubal cyst   . NO PAST SURGERIES    . WISDOM TOOTH EXTRACTION      No current outpatient medications on file.   No current facility-administered medications for this visit.      ALLERGIES: Patient has no known allergies.  Family History  Problem Relation Age of Onset  . Depression Other   . Osteoporosis Other   . Breast cancer Other        paternal great aunt  - breast cancer  . Heart disease Other   . Depression Paternal Aunt   . Cancer Paternal Aunt 60       paternal aunt - uterine cancer  . Cancer Maternal Aunt 15       maternal aunt - ovarian cancer  . Osteoporosis Mother   . Heart disease Maternal Grandfather     Social History   Socioeconomic History  . Marital status: Married    Spouse name: Not on file  . Number of children: 0  . Years of education: Not on file  . Highest education level: Not on file  Occupational History  . Occupation: Ship broker    Comment: elementary education  Social Needs  . Financial resource strain: Not on file  . Food insecurity:    Worry: Not on file    Inability: Not on file  . Transportation needs:    Medical: Not on file    Non-medical: Not on file  Tobacco Use  . Smoking status: Never Smoker  . Smokeless tobacco: Never Used  Substance and Sexual Activity  . Alcohol use: No    Alcohol/week: 0.0 standard drinks  .  Drug use: No  . Sexual activity: Yes    Partners: Male    Birth control/protection: None  Lifestyle  . Physical activity:    Days per week: Not on file    Minutes per session: Not on file  . Stress: Not on file  Relationships  . Social connections:    Talks on phone: Not on file    Gets together: Not on file    Attends religious service: Not on file    Active member of club or organization: Not on file    Attends meetings of clubs or organizations: Not on file    Relationship status: Not on file  . Intimate partner violence:    Fear of current or ex partner: Not on file    Emotionally abused: Not on file    Physically abused: Not on file    Forced sexual activity: Not on file  Other Topics Concern  . Not on file  Social History Narrative  . Not on file    Review of Systems  Constitutional: Negative.   HENT: Negative.   Eyes: Negative.   Respiratory: Negative.   Cardiovascular: Negative.   Gastrointestinal: Negative.   Genitourinary:       Light cramping   Musculoskeletal:       Breast tenderness  Skin: Negative.   Neurological: Negative.   Endo/Heme/Allergies: Negative.   Psychiatric/Behavioral: Negative.     PHYSICAL EXAMINATION:    BP 122/84 (BP Location: Right Arm, Patient Position: Sitting, Cuff Size: Normal)   Pulse 96   Wt 90 lb 9.6 oz (41.1 kg)   LMP 02/19/2018 (Exact Date)   BMI 16.31 kg/m     General appearance: alert, cooperative and appears stated age   ASSESSMENT +UPT, 3 weeks 5 days by definate 30 day cycle (+ ovulation on the 5)    PLAN Stat BhcG, will follow serially until high enough to see IUP on ultrasound Call with any pain or bleeding, ectopic risk factors reviewed.   An After Visit Summary was printed and given to the patient.

## 2018-03-20 ENCOUNTER — Other Ambulatory Visit: Payer: Self-pay | Admitting: *Deleted

## 2018-03-20 DIAGNOSIS — Z8759 Personal history of other complications of pregnancy, childbirth and the puerperium: Secondary | ICD-10-CM

## 2018-03-21 ENCOUNTER — Other Ambulatory Visit: Payer: Self-pay | Admitting: Obstetrics and Gynecology

## 2018-03-21 ENCOUNTER — Other Ambulatory Visit (INDEPENDENT_AMBULATORY_CARE_PROVIDER_SITE_OTHER): Payer: 59

## 2018-03-21 ENCOUNTER — Telehealth: Payer: Self-pay | Admitting: *Deleted

## 2018-03-21 DIAGNOSIS — Z8759 Personal history of other complications of pregnancy, childbirth and the puerperium: Secondary | ICD-10-CM

## 2018-03-21 LAB — BETA HCG QUANT (REF LAB): hCG Quant: 471 m[IU]/mL

## 2018-03-21 NOTE — Telephone Encounter (Signed)
Phone report of stat Beta HCG results called to Dr Talbert Nan. Orders received to notify patient and repeat on Monday.  Call to patient. Advised of results. Appropriate increase. Repeat BHCG scheduled for Monday, 03-24-18 at 0900.   Routing to Dr Talbert Nan. Encounter closed.

## 2018-03-24 ENCOUNTER — Other Ambulatory Visit (INDEPENDENT_AMBULATORY_CARE_PROVIDER_SITE_OTHER): Payer: 59

## 2018-03-24 ENCOUNTER — Telehealth: Payer: Self-pay | Admitting: *Deleted

## 2018-03-24 ENCOUNTER — Other Ambulatory Visit: Payer: Self-pay | Admitting: Emergency Medicine

## 2018-03-24 DIAGNOSIS — Z3201 Encounter for pregnancy test, result positive: Secondary | ICD-10-CM

## 2018-03-24 DIAGNOSIS — Z8759 Personal history of other complications of pregnancy, childbirth and the puerperium: Secondary | ICD-10-CM

## 2018-03-24 LAB — BETA HCG QUANT (REF LAB): hCG Quant: 1989 m[IU]/mL

## 2018-03-24 NOTE — Telephone Encounter (Signed)
Ebony from [employer] calling with STAT beta Hcg labs.   Hcg Quant [id number]  Results in Epic  Routing to Dr. Talbert Nan

## 2018-03-24 NOTE — Telephone Encounter (Signed)
-----   Message from Salvadore Dom, MD sent at 03/24/2018 12:47 PM EST ----- Please let the patient know that she had a wonderful rise in her BhcG and set her up for an ultrasound on Thursday. If she has any pain or bleeding before then she needs to be seen

## 2018-03-24 NOTE — Telephone Encounter (Signed)
Spoke with patient. Results given. Ultrasound scheduled for 03/27/2018 at 10 am. Patient is agreeable to date and time. Aware she will need to be seen if she has any pain or bleeding over the holiday. May be seen at [facility] if this occurs. Patient is agreeable and denies any problems or concerns at this time. Order for ultrasound placed. Encounter closed.

## 2018-03-27 ENCOUNTER — Other Ambulatory Visit: Payer: Self-pay

## 2018-03-27 ENCOUNTER — Ambulatory Visit (INDEPENDENT_AMBULATORY_CARE_PROVIDER_SITE_OTHER): Payer: 59

## 2018-03-27 ENCOUNTER — Encounter: Payer: Self-pay | Admitting: Obstetrics and Gynecology

## 2018-03-27 ENCOUNTER — Ambulatory Visit (INDEPENDENT_AMBULATORY_CARE_PROVIDER_SITE_OTHER): Payer: 59 | Admitting: Obstetrics and Gynecology

## 2018-03-27 VITALS — BP 108/64 | HR 80 | Wt 97.0 lb

## 2018-03-27 DIAGNOSIS — Z3490 Encounter for supervision of normal pregnancy, unspecified, unspecified trimester: Secondary | ICD-10-CM

## 2018-03-27 DIAGNOSIS — Z8759 Personal history of other complications of pregnancy, childbirth and the puerperium: Secondary | ICD-10-CM | POA: Diagnosis not present

## 2018-03-27 DIAGNOSIS — Z3201 Encounter for pregnancy test, result positive: Secondary | ICD-10-CM | POA: Diagnosis not present

## 2018-03-27 NOTE — Progress Notes (Signed)
    Addendum: The gestational sac was seen intrauterine.

## 2018-03-27 NOTE — Progress Notes (Signed)
GYNECOLOGY  VISIT   HPI: 27 y.o.   Married White or Caucasian Not Hispanic or Latino  female   G1P0010 with Patient's last menstrual period was 02/19/2018 (exact date). here for consult following ultrasound for pregnancy location. H/O ectopic. The patient is feeling well, no bleeding, no pain.  GYNECOLOGIC HISTORY: Patient's last menstrual period was 02/19/2018 (exact date). Contraception:None Menopausal hormone therapy: None        OB History    Gravida  1   Para  0   Term  0   Preterm  0   AB  1   Living  0     SAB  0   TAB  0   Ectopic  1   Multiple  0   Live Births  0              Patient Active Problem List   Diagnosis Date Noted  . History of ectopic pregnancy 08/28/2017  . Rh negative status during pregnancy 08/13/2017  . Neck pain 07/15/2015  . Generalized anxiety disorder 07/08/2015  . Left breast mass 12/24/2013    Past Medical History:  Diagnosis Date  . Anxiety   . Headache   . History of chicken pox 1996  . Infection    UTI  . Medical history non-contributory     Past Surgical History:  Procedure Laterality Date  . LAPAROSCOPY Left 08/20/2017   Procedure: LAPAROSCOPY OPERATIVE with treatment of left ectopic pregnancy;  Surgeon: Salvadore Dom, MD;  Location: [facility];  Service: Gynecology;  Laterality: Left;  and removal of left paratubal cyst   . NO PAST SURGERIES    . WISDOM TOOTH EXTRACTION      No current outpatient medications on file.   No current facility-administered medications for this visit.      ALLERGIES: Patient has no known allergies.  Family History  Problem Relation Age of Onset  . Depression Other   . Osteoporosis Other   . Breast cancer Other        paternal great aunt - breast cancer  . Heart disease Other   . Depression Paternal Aunt   . Cancer Paternal Aunt 40       paternal aunt - uterine cancer  . Cancer Maternal Aunt 80       maternal aunt - ovarian cancer  . Osteoporosis Mother   . Heart  disease Maternal Grandfather     Social History   Socioeconomic History  . Marital status: Married    Spouse name: Not on file  . Number of children: 0  . Years of education: Not on file  . Highest education level: Not on file  Occupational History  . Occupation: Ship broker    Comment: elementary education  Social Needs  . Financial resource strain: Not on file  . Food insecurity:    Worry: Not on file    Inability: Not on file  . Transportation needs:    Medical: Not on file    Non-medical: Not on file  Tobacco Use  . Smoking status: Never Smoker  . Smokeless tobacco: Never Used  Substance and Sexual Activity  . Alcohol use: No    Alcohol/week: 0.0 standard drinks  . Drug use: No  . Sexual activity: Yes    Partners: Male    Birth control/protection: None  Lifestyle  . Physical activity:    Days per week: Not on file    Minutes per session: Not on file  . Stress: Not  on file  Relationships  . Social connections:    Talks on phone: Not on file    Gets together: Not on file    Attends religious service: Not on file    Active member of club or organization: Not on file    Attends meetings of clubs or organizations: Not on file    Relationship status: Not on file  . Intimate partner violence:    Fear of current or ex partner: Not on file    Emotionally abused: Not on file    Physically abused: Not on file    Forced sexual activity: Not on file  Other Topics Concern  . Not on file  Social History Narrative  . Not on file    Review of Systems  Constitutional: Negative.   HENT: Negative.   Eyes: Negative.   Respiratory: Negative.   Cardiovascular: Negative.   Gastrointestinal: Negative.   Genitourinary: Negative.   Musculoskeletal: Negative.   Skin: Negative.   Neurological: Negative.   Endo/Heme/Allergies: Negative.   Psychiatric/Behavioral: Negative.     PHYSICAL EXAMINATION:    BP 108/64 (BP Location: Right Arm, Patient Position: Sitting, Cuff Size:  Normal)   Pulse 80   Wt 97 lb (44 kg)   LMP 02/19/2018 (Exact Date)   BMI 17.46 kg/m     General appearance: alert, cooperative and appears stated age  ASSESSMENT Early pregnancy, U/S c/w IUP    PLAN She will establish care with OB.  We discussed viability scan here if she isn't seen by OB by 8 weeks   An After Visit Summary was printed and given to the patient.

## 2018-04-29 LAB — OB RESULTS CONSOLE ANTIBODY SCREEN: Antibody Screen: NEGATIVE

## 2018-04-29 LAB — OB RESULTS CONSOLE ABO/RH: RH Type: NEGATIVE

## 2018-04-29 LAB — OB RESULTS CONSOLE GC/CHLAMYDIA
Chlamydia: NEGATIVE
Gonorrhea: NEGATIVE

## 2018-04-29 LAB — OB RESULTS CONSOLE HIV ANTIBODY (ROUTINE TESTING): HIV: NONREACTIVE

## 2018-04-29 LAB — OB RESULTS CONSOLE RUBELLA ANTIBODY, IGM: Rubella: IMMUNE

## 2018-04-29 LAB — OB RESULTS CONSOLE RPR: RPR: NONREACTIVE

## 2018-04-29 LAB — OB RESULTS CONSOLE HEPATITIS B SURFACE ANTIGEN: Hepatitis B Surface Ag: NEGATIVE

## 2018-07-09 IMAGING — US US OB < 14 WEEKS - US OB TV
1 series · 15 of 28 positions shown · non-contrast
Comparison: None.

CLINICAL DATA: Intermittent right lower quadrant pain. Estimated
gestational age of 6 weeks, 1 day by LMP.

EXAM:
OBSTETRIC <14 WK US AND TRANSVAGINAL OB US
TECHNIQUE: Both transabdominal and transvaginal ultrasound examinations were
performed for complete evaluation of the gestation as well as the
maternal uterus, adnexal regions, and pelvic cul-de-sac.
Transvaginal technique was performed to assess early pregnancy.

[Series 1: us ob < 14 weeks - us ob tv · 15 of 51 slices shown]
[im 1/51]
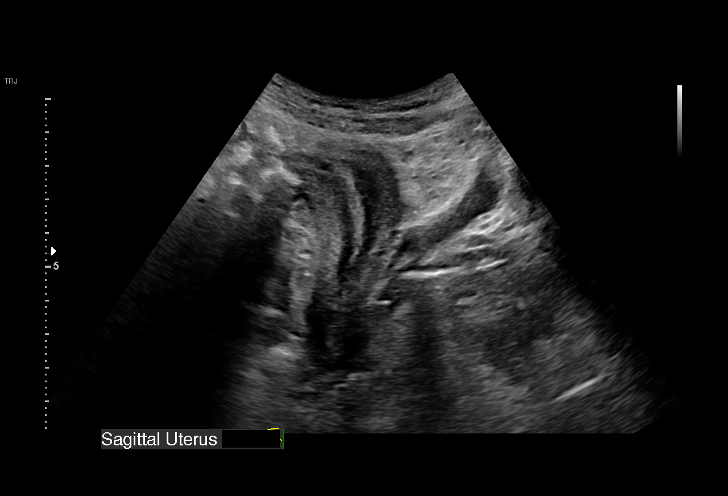
[im 4/51]
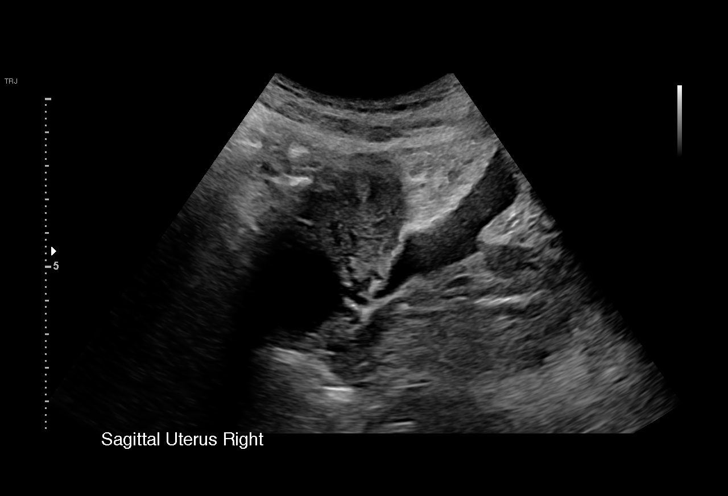
[im 8/51]
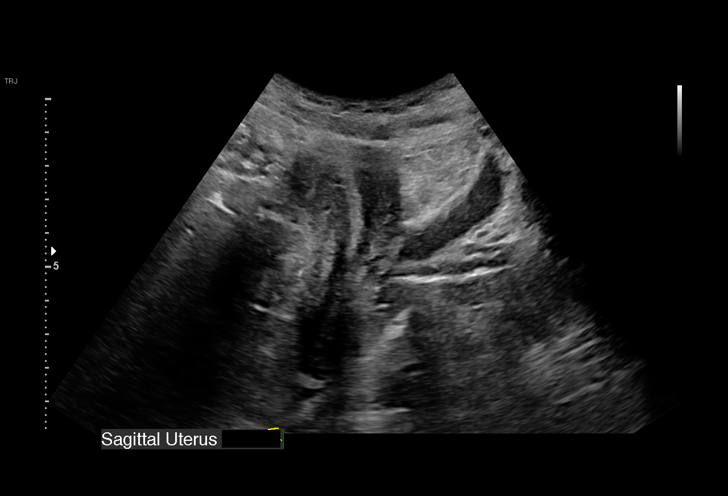
[im 12/51]
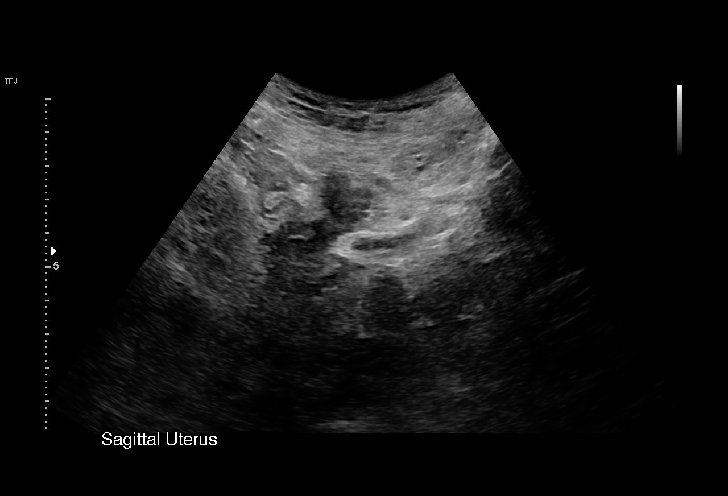
[im 15/51]
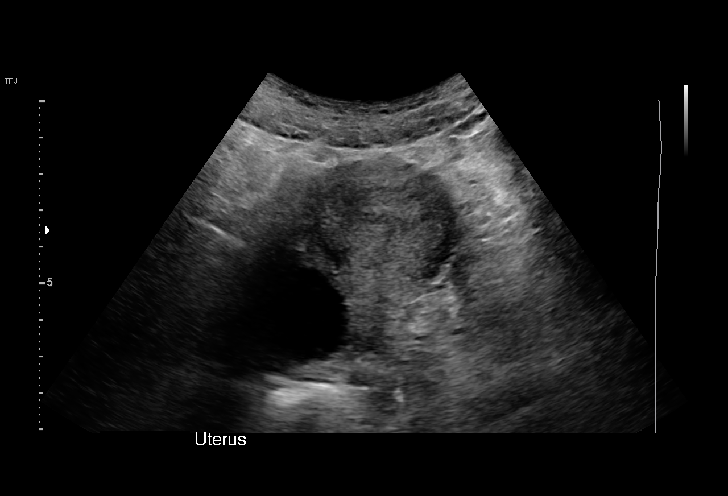
[im 19/51]
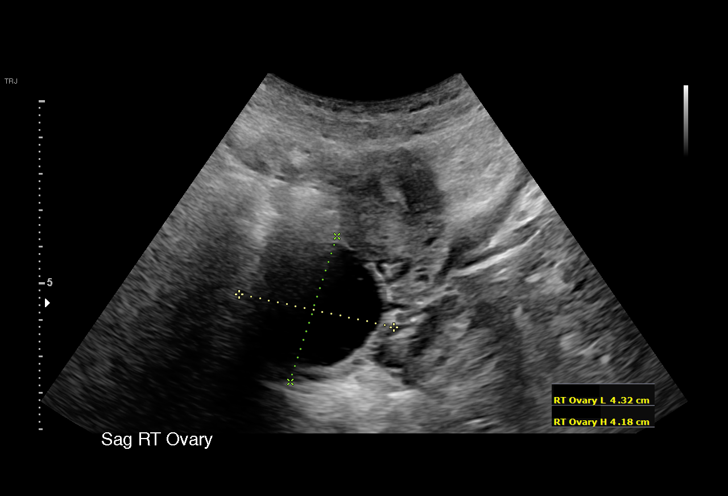
[im 23/51]
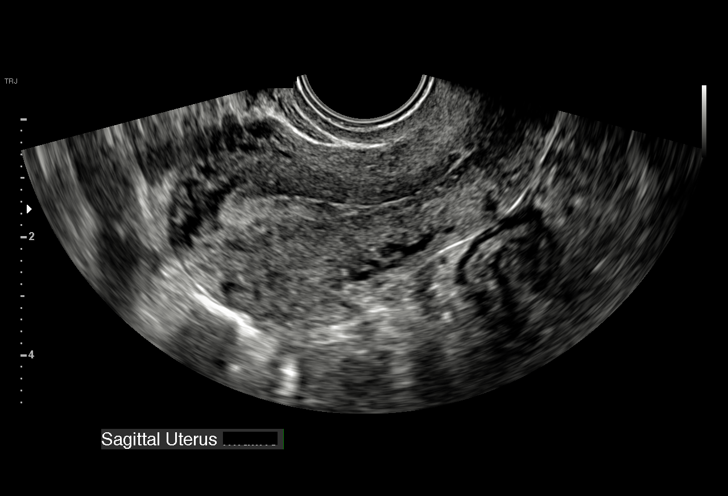
[im 26/51]
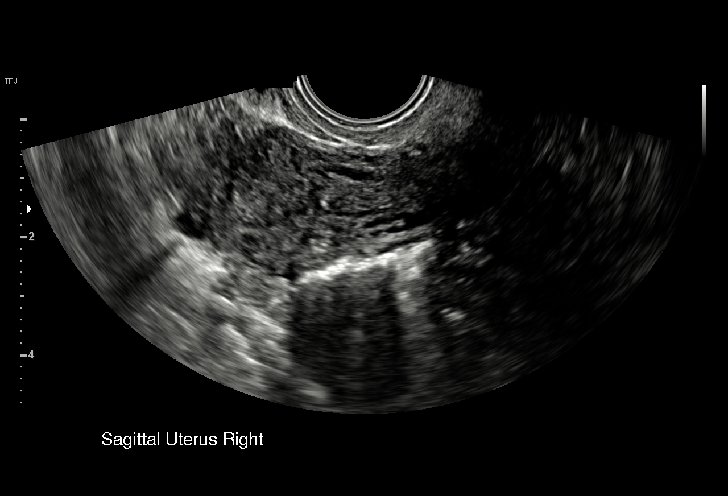
[im 28/51]
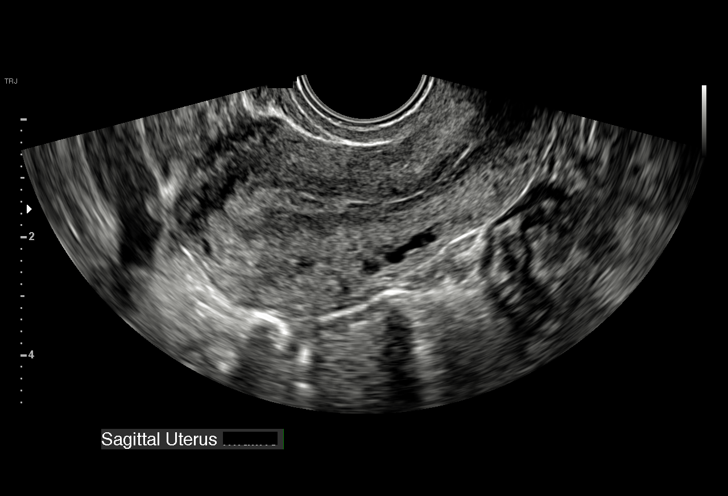
[im 32/51]
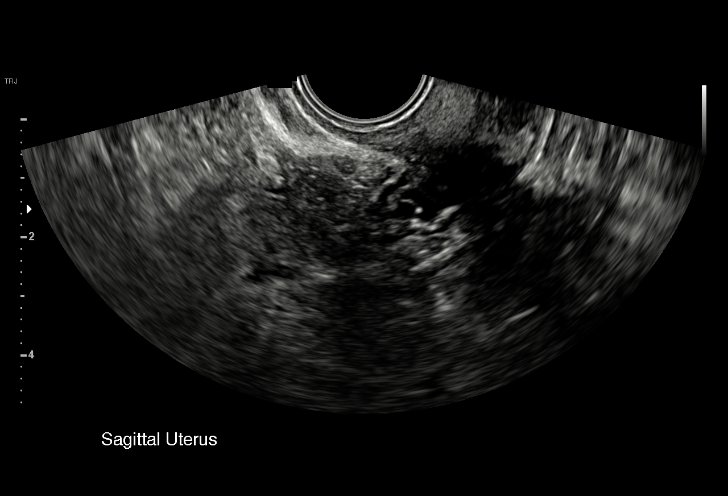
[im 36/51]
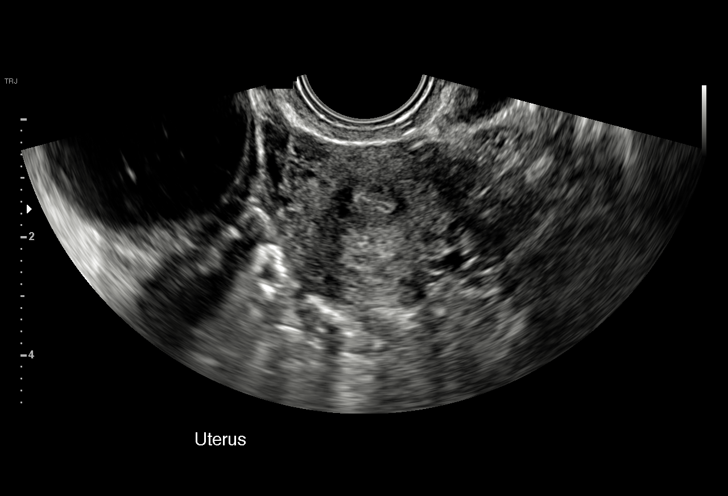
[im 39/51]
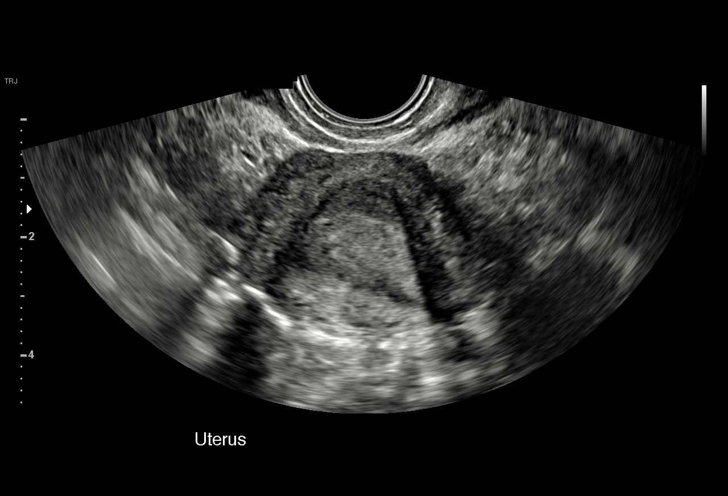
[im 43/51]
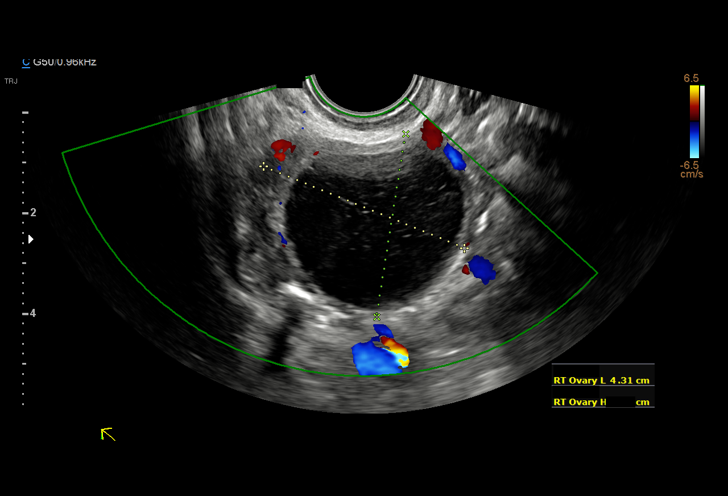
[im 47/51]
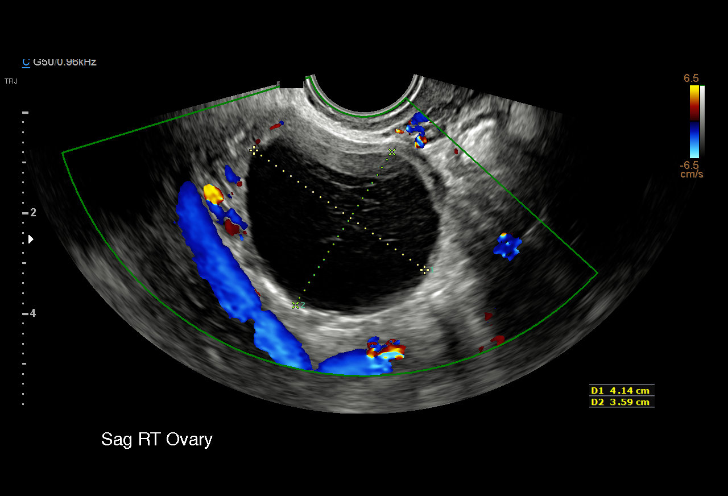
[im 51/51]
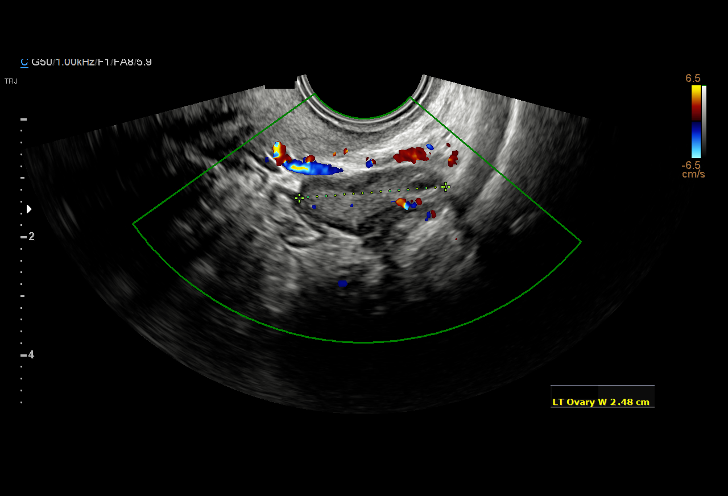

[15 of 28 positions shown; findings below may reference images not displayed]

FINDINGS: Intrauterine gestational sac: None

Yolk sac:  Not Visualized.

Embryo:  Not Visualized.

Cardiac Activity: Not Visualized.

Maternal uterus/adnexae: Large 4.1 x 3.6 x 3.9 cm cyst in the right
ovary with internal reticular echoes and no vascularity. The left
ovary is unremarkable.
IMPRESSION: 1. No IUP is visualized.

By definition, in the setting of a positive pregnancy test, this
reflects a pregnancy of unknown location. Differential
considerations include early normal IUP, abnormal IUP/missed
abortion, or nonvisualized ectopic pregnancy.

Serial beta HCG is suggested. Consider repeat pelvic ultrasound in
14 days.
2. 4.1 cm hemorrhagic cyst in the right ovary. No further follow-up
is required. This recommendation follows the consensus statement:
Management of Asymptomatic Ovarian and Other Adnexal Cysts Imaged at
US: Society of Radiologists in Ultrasound Consensus Conference

## 2018-07-12 IMAGING — US US OB TRANSVAGINAL
1 series · 12 of 14 positions shown · non-contrast
Comparison: Pelvic ultrasound 08/15/2017

CLINICAL DATA: Patient with abnormal rise in beta HCG.

EXAM:
TRANSVAGINAL OB ULTRASOUND
TECHNIQUE: Transvaginal ultrasound was performed for complete evaluation of the
gestation as well as the maternal uterus, adnexal regions, and
pelvic cul-de-sac.

[Series 1: us ob transvaginal · 14 acquisitions, 12 frames shown]
[im 1/14]
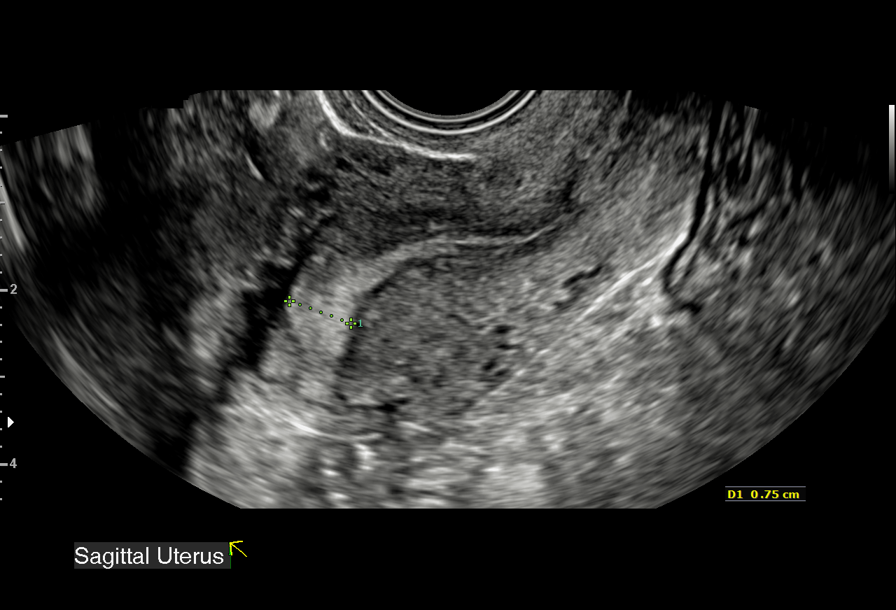
[im 2/14]
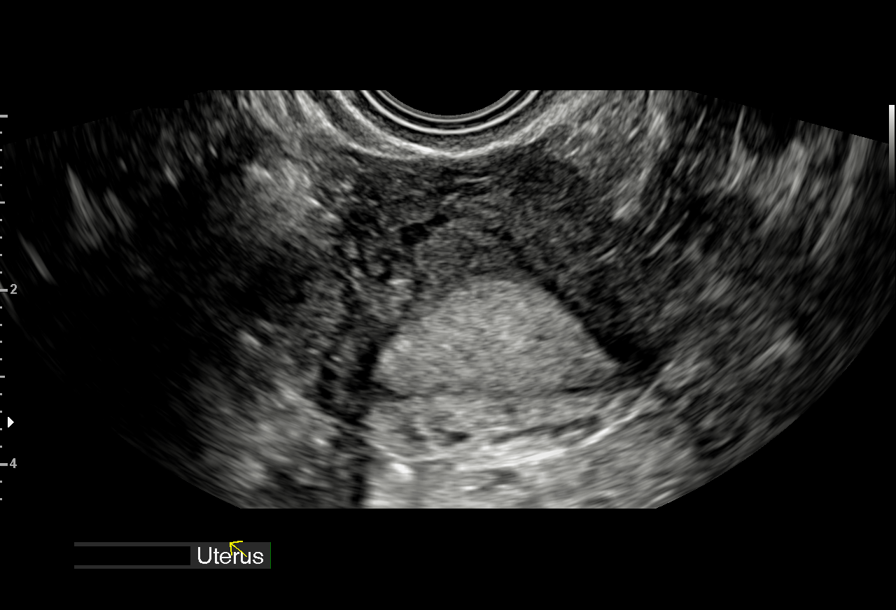
[im 3/14]
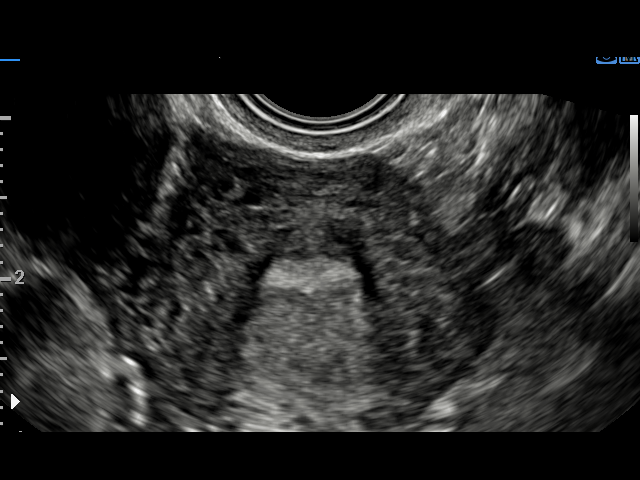
[im 5/14]
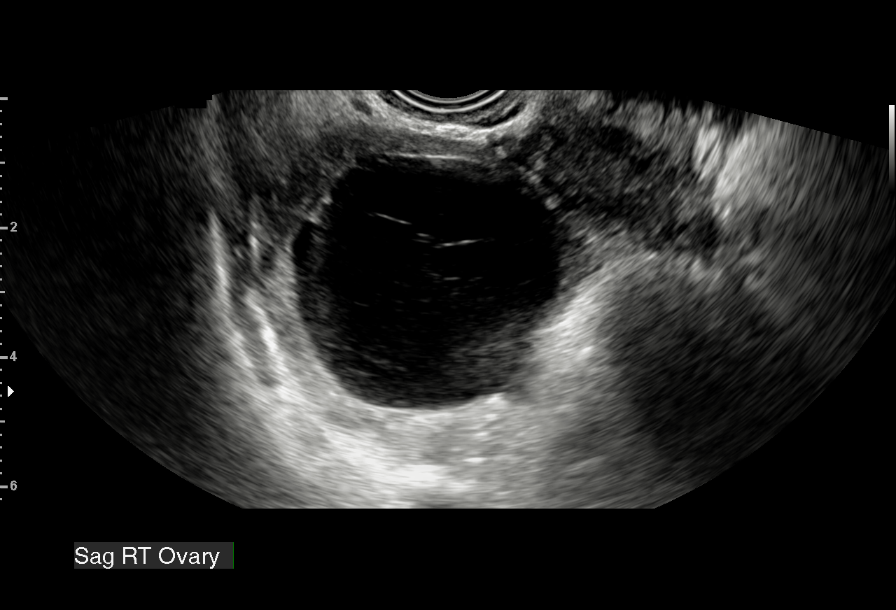
[im 6/14]
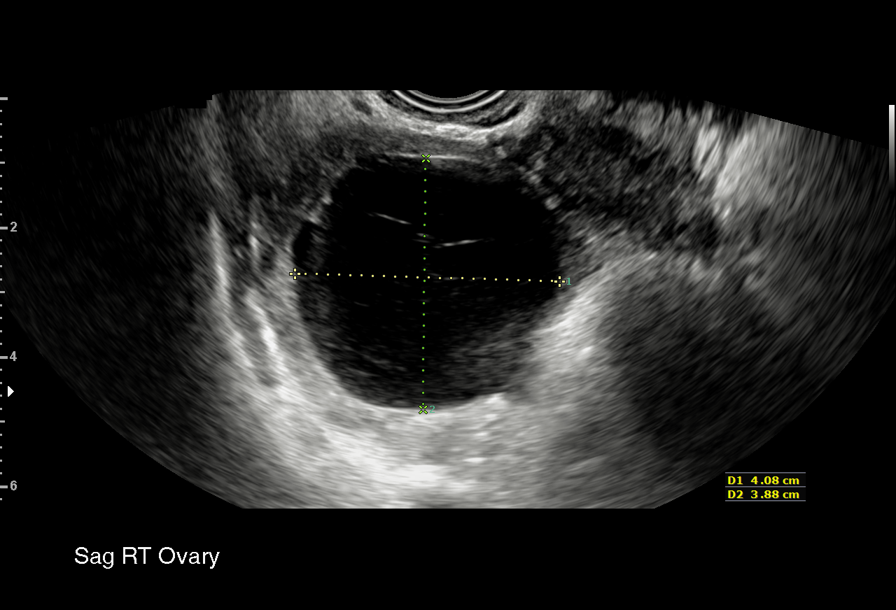
[im 7/14]
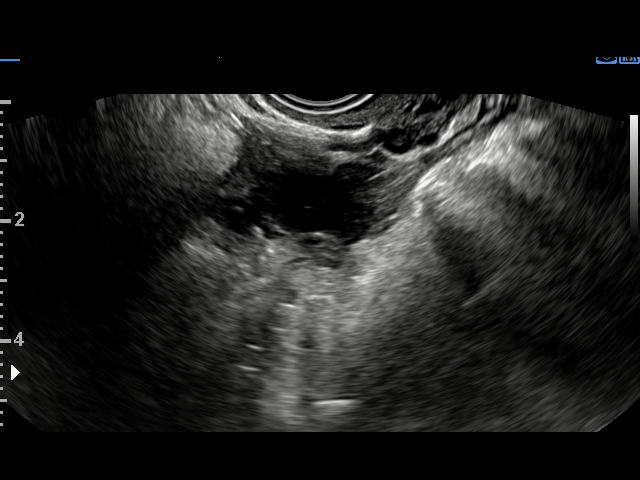
[im 8/14]
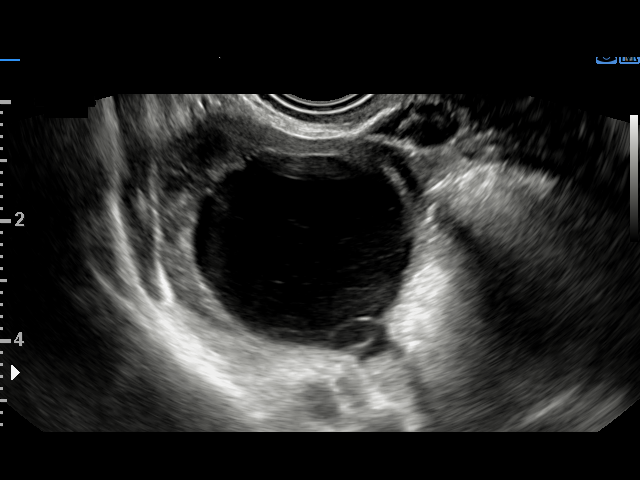
[im 9/14]
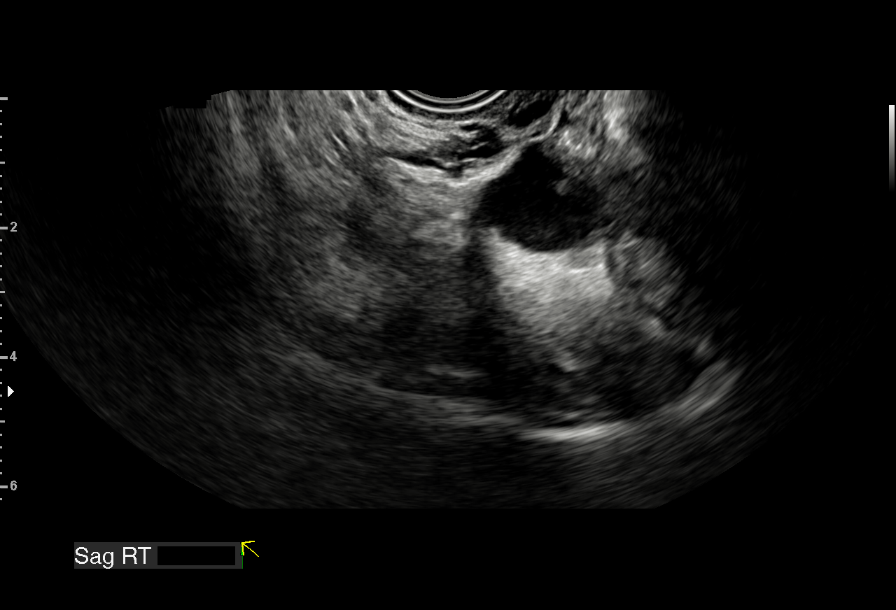
[im 10/14]
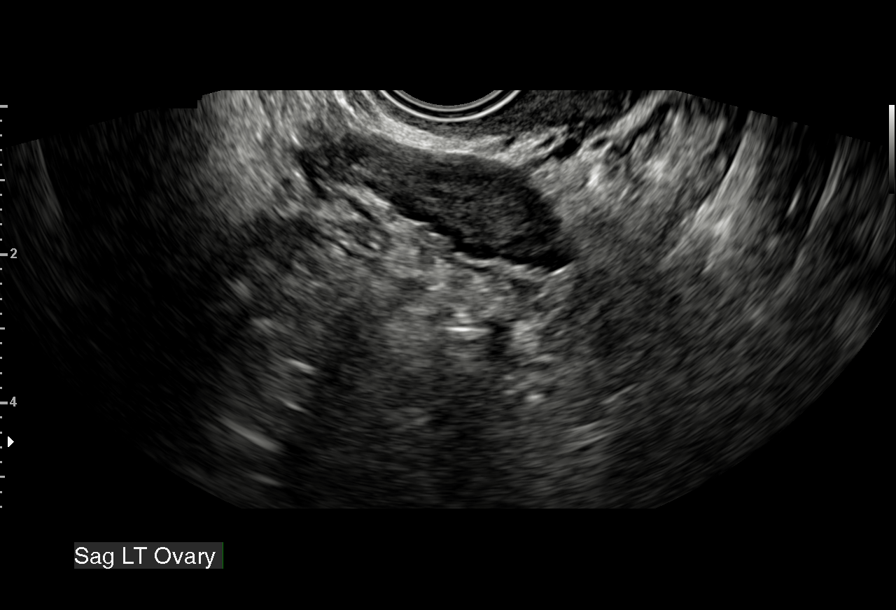
[im 12/14]
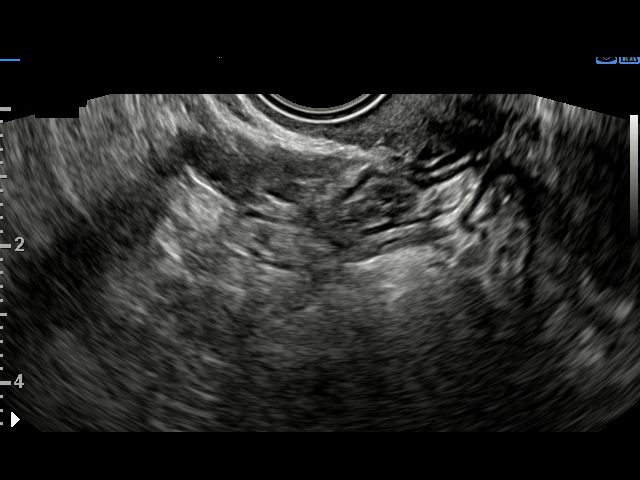
[im 13/14]
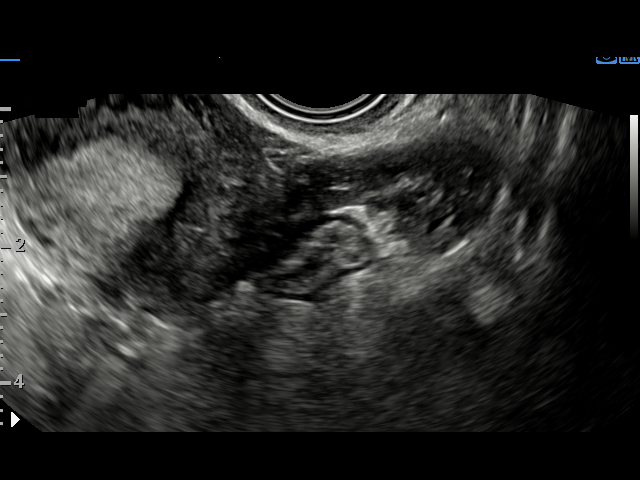
[im 14/14]
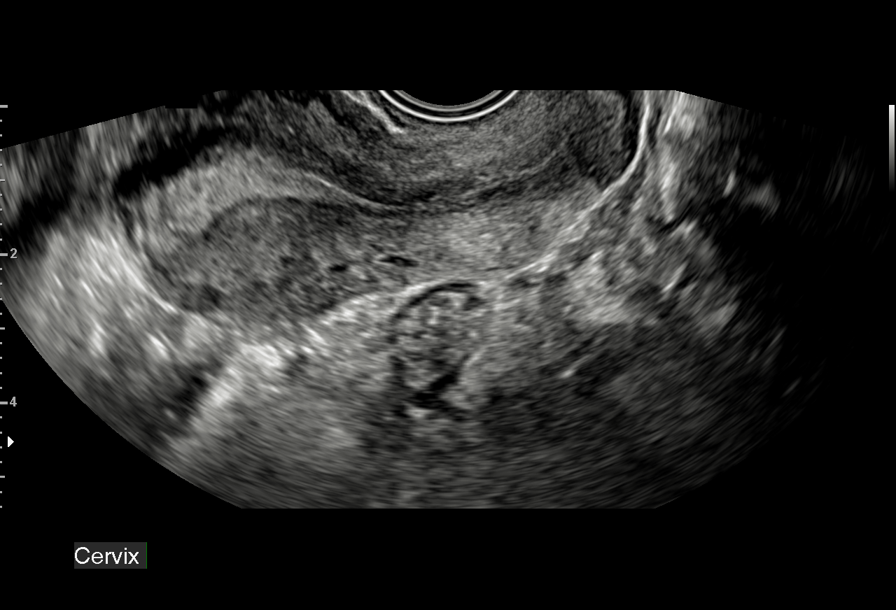

[12 of 14 positions shown; findings below may reference images not displayed]

FINDINGS: Intrauterine gestational sac: None

Yolk sac:  Not Visualized.

Embryo:  Not Visualized.

Cardiac Activity: Not Visualized.

Maternal uterus/adnexae: Within the right ovary there is a 4.0 x
cm hemorrhagic cyst. The left ovary is unremarkable. No adnexal mass
identified. Small amount of free fluid in the pelvis.
IMPRESSION: 1. No intrauterine pregnancy identified. In the setting of positive
pregnancy test and no definite intrauterine pregnancy, this reflects
a pregnancy of unknown location. Differential considerations include
early normal IUP, abnormal IUP, or nonvisualized ectopic pregnancy.
Differentiation is achieved with serial beta HCG supplemented by
repeat sonography as clinically warranted.
2. Hemorrhagic cyst right ovary.

## 2018-07-14 IMAGING — US US OB TRANSVAGINAL
1 series · 15 of 28 positions shown · non-contrast
Comparison: 08/18/2017 and 08/15/2017 obstetric scans.

CLINICAL DATA: 27-year-old pregnant female presents with left
pelvic pain and bleeding. Pregnancy of uncertain location on recent
obstetric scans.

EDC by LMP: 04/09/2018, projecting to an expected gestational age of
6 weeks 6 days.
EXAM:
TRANSVAGINAL OB ULTRASOUND
TECHNIQUE: Transvaginal ultrasound was performed for complete evaluation of the
gestation as well as the maternal uterus, adnexal regions, and
pelvic cul-de-sac.

[Series 1: us ob transvaginal · 68 acquisitions, 15 frames shown]
[im 1/68]
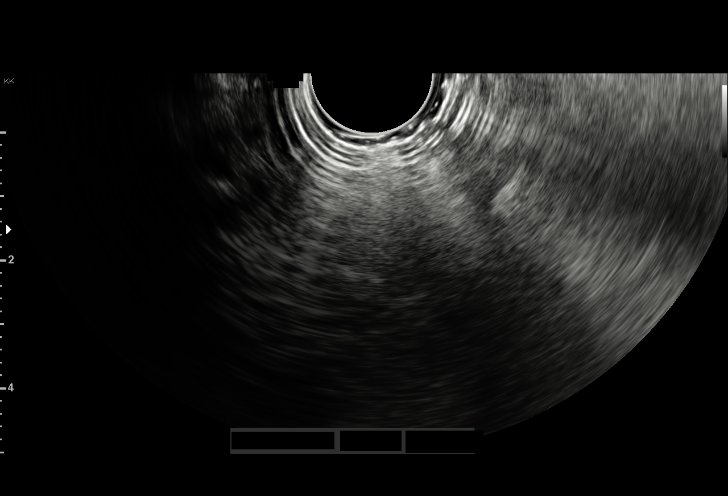
[im 5/68]
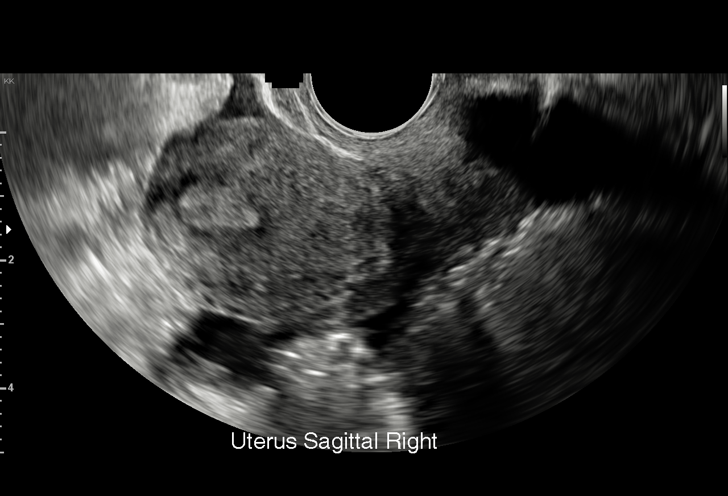
[im 10/68]
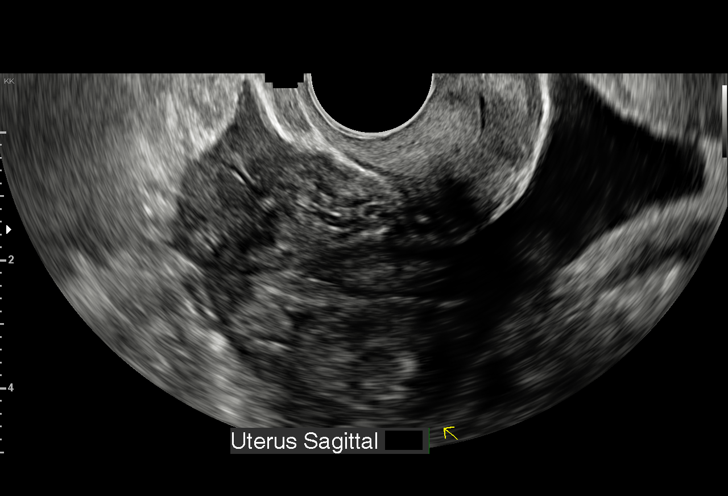
[im 15/68]
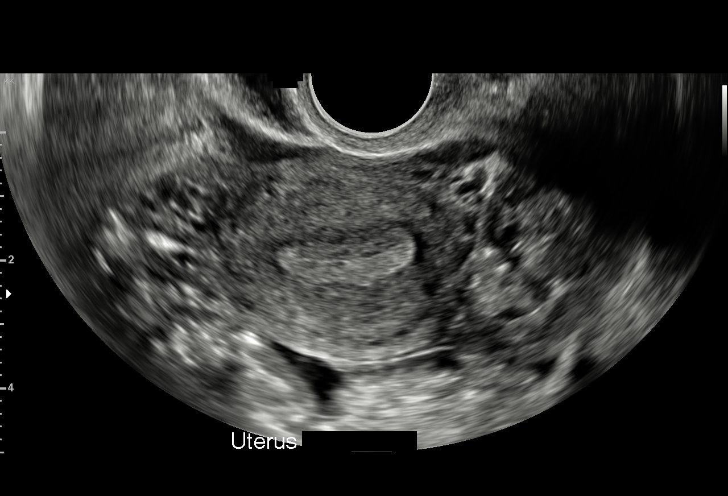
[im 20/68]
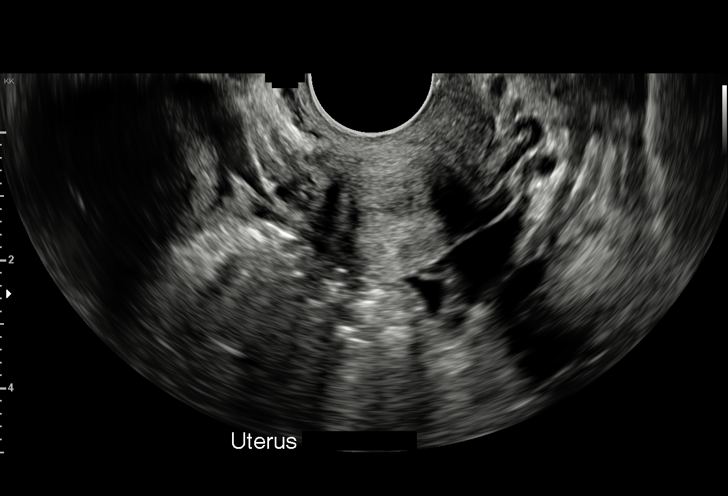
[im 25/68]
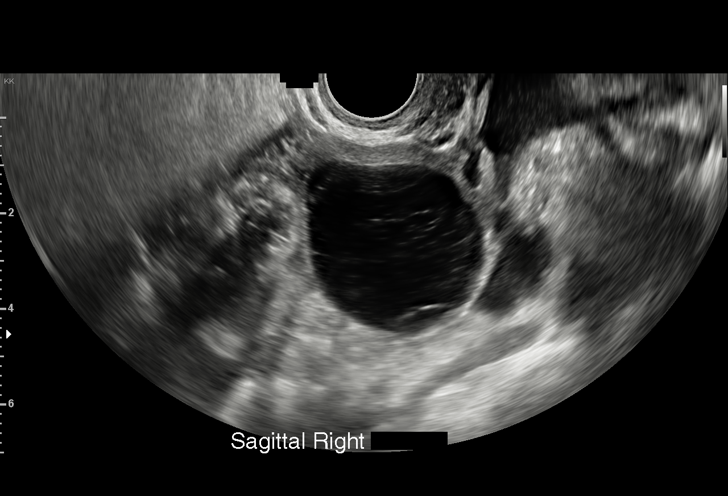
[im 30/68]
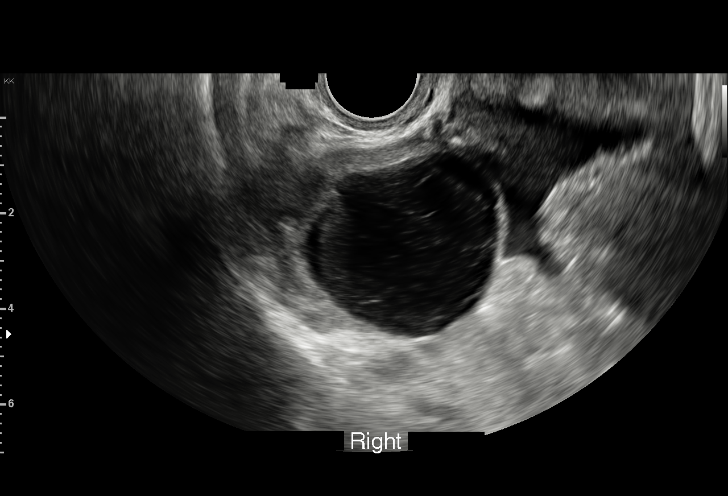
[im 35/68]
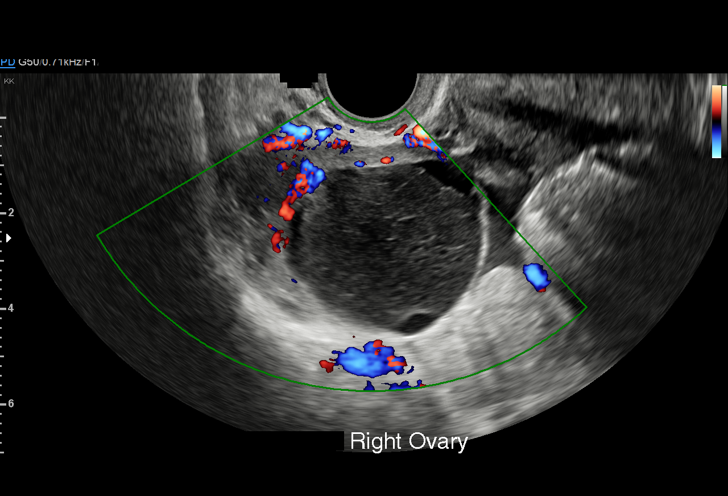
[im 38/68]
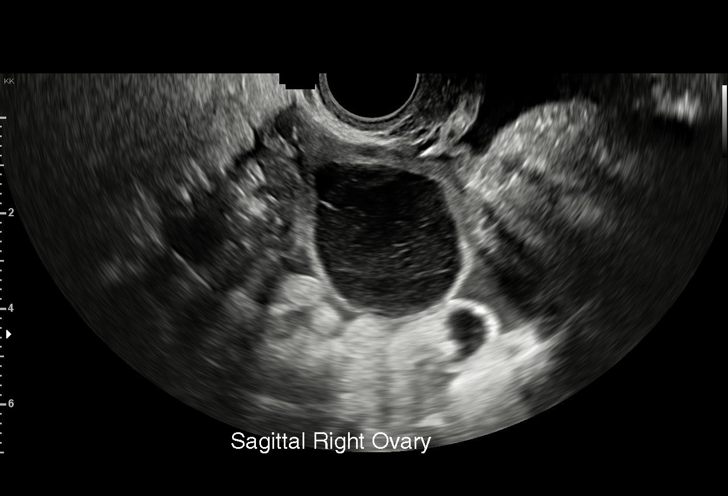
[im 43/68]
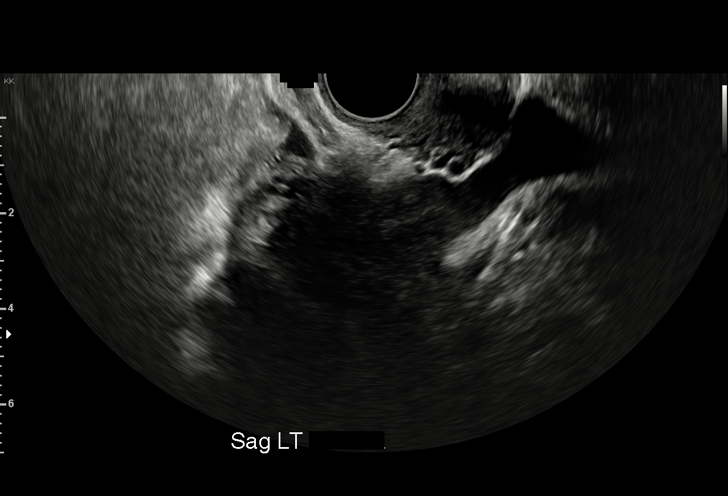
[im 48/68]
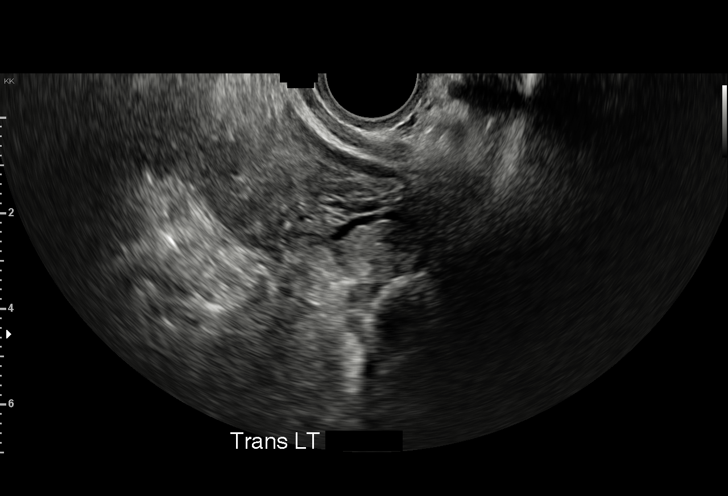
[im 53/68]
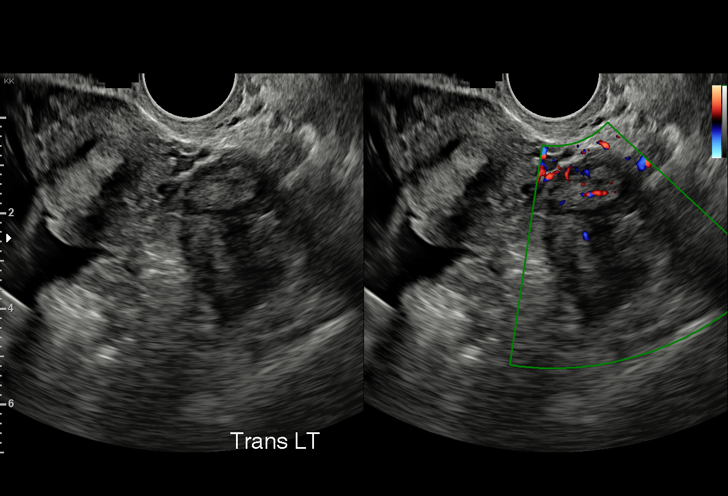
[im 58/68]
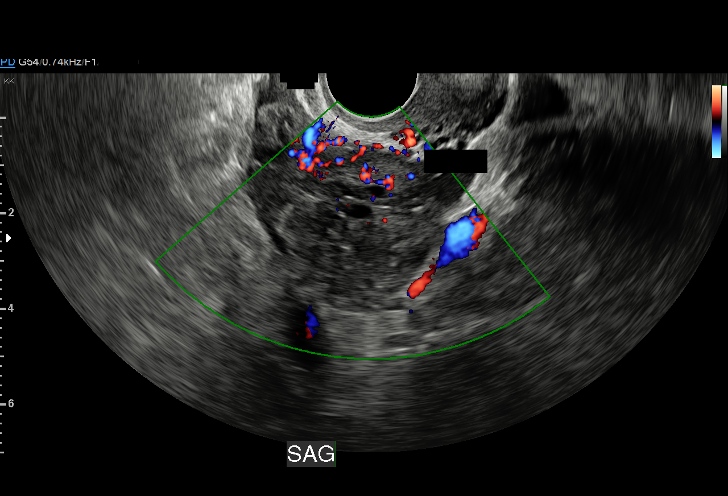
[im 63/68]
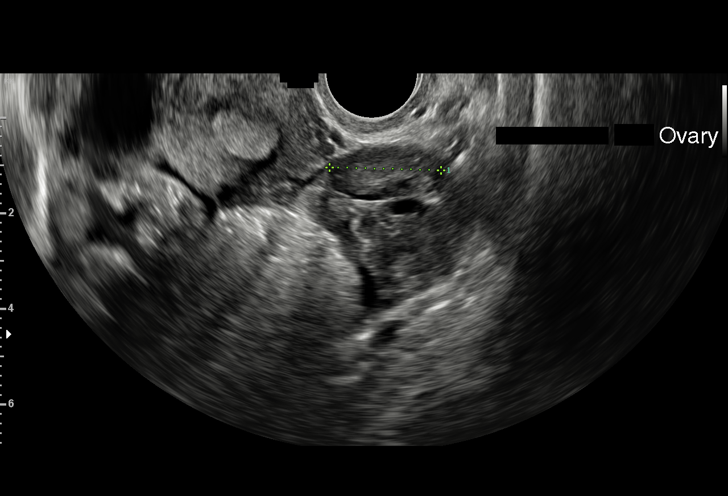
[im 68/68]
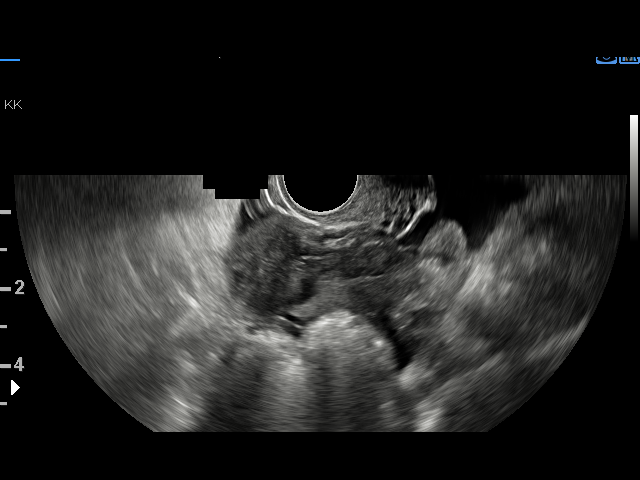

[15 of 28 positions shown; findings below may reference images not displayed]

FINDINGS: Anteverted uterus is normal in size and configuration, with no
uterine fibroids or other myometrial abnormalities. No intrauterine
gestational sac. Bilayer endometrial thickness 6 mm. No endometrial
cavity fluid or focal endometrial mass.

Right ovary measures 5.3 x 4.3 x 5.0 cm and contains a 4.3 x 3.6 x
4.1 cm complex cystic mass with heterogeneous internal echoes and no
internal vascularity, compatible with a right ovarian hemorrhagic
cyst, which measured 4.1 x 3.9 cm on 08/18/2017, not appreciably
changed. No additional right ovarian or right adnexal masses.

Left ovary measures 2.9 x 1.0 x 2.3 cm. Separate from the left
ovary, and located between the left ovary and uterus, there is a new
mixed echogenicity 3.8 x 1.7 x 3.4 cm left adnexal mass with some
internal vascularity on color Doppler. No discrete gestational sac,
embryo or embryonic cardiac activity are demonstrated within this
left adnexal mass.

There is new moderate volume free fluid in the pelvic cul-de-sac and
left adnexa with low level internal echoes, compatible with
hemoperitoneum.
IMPRESSION: 1. New mixed echogenicity 3.8 x 1.7 x 3.4 cm left adnexal mass
located between the left ovary and uterus, separate from the left
ovary, most compatible with left tubal ectopic gestation.
2. New moderate volume hemoperitoneum in the pelvic cul-de-sac and
left adnexa, cannot exclude left tubal rupture.
3. No intrauterine gestation.
4. Stable 4.3 cm right ovarian hemorrhagic cyst.

These results were called by telephone at the time of interpretation
on 08/20/2017 at [DATE] to Dr. HUSSEN BEN DEENA WILSON , who verbally
acknowledged these results.

## 2018-08-07 ENCOUNTER — Ambulatory Visit: Payer: 59 | Admitting: Obstetrics and Gynecology

## 2018-10-31 LAB — OB RESULTS CONSOLE GBS: GBS: NEGATIVE

## 2018-11-19 ENCOUNTER — Telehealth (HOSPITAL_COMMUNITY): Payer: Self-pay | Admitting: *Deleted

## 2018-11-19 ENCOUNTER — Encounter (HOSPITAL_COMMUNITY): Payer: Self-pay | Admitting: *Deleted

## 2018-11-19 NOTE — Telephone Encounter (Signed)
Preadmission screen  

## 2018-11-25 ENCOUNTER — Encounter (HOSPITAL_COMMUNITY): Payer: Self-pay | Admitting: *Deleted

## 2018-11-25 ENCOUNTER — Inpatient Hospital Stay (HOSPITAL_COMMUNITY): Payer: 59 | Admitting: Anesthesiology

## 2018-11-25 ENCOUNTER — Encounter (HOSPITAL_COMMUNITY)
Admission: AD | Disposition: A | Discharge status: Home / Self Care | Payer: Self-pay | Source: Home / Self Care | Attending: Obstetrics and Gynecology

## 2018-11-25 ENCOUNTER — Inpatient Hospital Stay (HOSPITAL_COMMUNITY)
Admission: AD | Admit: 2018-11-25 | Discharge: 2018-11-28 | DRG: 788 | Disposition: A | Discharge status: Home / Self Care | Payer: 59 | Attending: Obstetrics and Gynecology | Admitting: Obstetrics and Gynecology

## 2018-11-25 ENCOUNTER — Other Ambulatory Visit: Payer: Self-pay

## 2018-11-25 DIAGNOSIS — O339 Maternal care for disproportion, unspecified: Secondary | ICD-10-CM | POA: Diagnosis present

## 2018-11-25 DIAGNOSIS — Z98891 History of uterine scar from previous surgery: Secondary | ICD-10-CM

## 2018-11-25 DIAGNOSIS — O9902 Anemia complicating childbirth: Principal | ICD-10-CM | POA: Diagnosis present

## 2018-11-25 DIAGNOSIS — O26893 Other specified pregnancy related conditions, third trimester: Secondary | ICD-10-CM | POA: Diagnosis present

## 2018-11-25 DIAGNOSIS — D500 Iron deficiency anemia secondary to blood loss (chronic): Secondary | ICD-10-CM | POA: Diagnosis present

## 2018-11-25 DIAGNOSIS — Z20828 Contact with and (suspected) exposure to other viral communicable diseases: Secondary | ICD-10-CM | POA: Diagnosis present

## 2018-11-25 DIAGNOSIS — Z3A39 39 weeks gestation of pregnancy: Secondary | ICD-10-CM

## 2018-11-25 DIAGNOSIS — Z6791 Unspecified blood type, Rh negative: Secondary | ICD-10-CM | POA: Diagnosis not present

## 2018-11-25 LAB — CBC
HCT: 35.8 % — ABNORMAL LOW (ref 36.0–46.0)
Hemoglobin: 11.1 g/dL — ABNORMAL LOW (ref 12.0–15.0)
MCH: 25.7 pg — ABNORMAL LOW (ref 26.0–34.0)
MCHC: 31 g/dL (ref 30.0–36.0)
MCV: 82.9 fL (ref 80.0–100.0)
Platelets: 264 10*3/uL (ref 150–400)
RBC: 4.32 MIL/uL (ref 3.87–5.11)
RDW: 15.3 % (ref 11.5–15.5)
WBC: 13.8 10*3/uL — ABNORMAL HIGH (ref 4.0–10.5)
nRBC: 0 % (ref 0.0–0.2)

## 2018-11-25 LAB — SARS CORONAVIRUS 2 (TAT 6-24 HRS): SARS Coronavirus 2: NEGATIVE

## 2018-11-25 LAB — RPR: RPR Ser Ql: NONREACTIVE

## 2018-11-25 SURGERY — Surgical Case
Anesthesia: Epidural

## 2018-11-25 MED ORDER — TERBUTALINE SULFATE 1 MG/ML IJ SOLN
INTRAMUSCULAR | Status: AC
  Filled 2018-11-25: qty 1

## 2018-11-25 MED ORDER — OXYTOCIN 40 UNITS IN NORMAL SALINE INFUSION - SIMPLE MED: 2.5000 [IU]/h | INTRAVENOUS | Status: DC

## 2018-11-25 MED ORDER — PHENYLEPHRINE 40 MCG/ML (10ML) SYRINGE FOR IV PUSH (FOR BLOOD PRESSURE SUPPORT)
PREFILLED_SYRINGE | INTRAVENOUS | Status: AC
  Administered 2018-11-25: 80 ug
  Filled 2018-11-25: qty 10

## 2018-11-25 MED ORDER — PHENYLEPHRINE 40 MCG/ML (10ML) SYRINGE FOR IV PUSH (FOR BLOOD PRESSURE SUPPORT): 80.0000 ug | PREFILLED_SYRINGE | INTRAVENOUS | Status: DC | PRN

## 2018-11-25 MED ORDER — CEFAZOLIN SODIUM-DEXTROSE 2-4 GM/100ML-% IV SOLN: 2.0000 g | Freq: Once | INTRAVENOUS | Status: DC

## 2018-11-25 MED ORDER — SODIUM BICARBONATE 8.4 % IV SOLN
INTRAVENOUS | Status: AC
  Filled 2018-11-25: qty 50

## 2018-11-25 MED ORDER — SODIUM CHLORIDE 0.9 % IV SOLN
INTRAVENOUS | Status: DC | PRN
  Administered 2018-11-25: 40 [IU] via INTRAVENOUS

## 2018-11-25 MED ORDER — CEFAZOLIN SODIUM-DEXTROSE 2-4 GM/100ML-% IV SOLN
INTRAVENOUS | Status: AC
  Filled 2018-11-25: qty 100

## 2018-11-25 MED ORDER — OXYTOCIN BOLUS FROM INFUSION: 500.0000 mL | Freq: Once | INTRAVENOUS | Status: DC

## 2018-11-25 MED ORDER — LIDOCAINE-EPINEPHRINE (PF) 2 %-1:200000 IJ SOLN
INTRAMUSCULAR | Status: AC
  Filled 2018-11-25: qty 10

## 2018-11-25 MED ORDER — STERILE WATER FOR IRRIGATION IR SOLN
Status: DC | PRN
  Administered 2018-11-25: 1000 mL

## 2018-11-25 MED ORDER — ACETAMINOPHEN 325 MG PO TABS: 650.0000 mg | ORAL_TABLET | ORAL | Status: DC | PRN

## 2018-11-25 MED ORDER — FLEET ENEMA 7-19 GM/118ML RE ENEM: 1.0000 | ENEMA | Freq: Every day | RECTAL | Status: DC | PRN

## 2018-11-25 MED ORDER — BUTORPHANOL TARTRATE 1 MG/ML IJ SOLN
1.0000 mg | INTRAMUSCULAR | Status: DC | PRN
  Administered 2018-11-25: 1 mg via INTRAVENOUS
  Filled 2018-11-25: qty 1

## 2018-11-25 MED ORDER — SCOPOLAMINE 1 MG/3DAYS TD PT72: 1.0000 | MEDICATED_PATCH | TRANSDERMAL | Status: DC

## 2018-11-25 MED ORDER — PHENYLEPHRINE HCL (PRESSORS) 10 MG/ML IV SOLN
INTRAVENOUS | Status: DC | PRN
  Administered 2018-11-25 (×4): 80 ug via INTRAVENOUS

## 2018-11-25 MED ORDER — ONDANSETRON HCL 4 MG/2ML IJ SOLN
INTRAMUSCULAR | Status: AC
  Filled 2018-11-25: qty 4

## 2018-11-25 MED ORDER — LIDOCAINE HCL (PF) 1 % IJ SOLN: 30.0000 mL | INTRAMUSCULAR | Status: DC | PRN

## 2018-11-25 MED ORDER — OXYTOCIN 40 UNITS IN NORMAL SALINE INFUSION - SIMPLE MED
INTRAVENOUS | Status: AC
  Filled 2018-11-25: qty 1000

## 2018-11-25 MED ORDER — LIDOCAINE HCL (PF) 1 % IJ SOLN
INTRAMUSCULAR | Status: DC | PRN
  Administered 2018-11-25: 11 mL via EPIDURAL

## 2018-11-25 MED ORDER — OXYCODONE-ACETAMINOPHEN 5-325 MG PO TABS: 2.0000 | ORAL_TABLET | ORAL | Status: DC | PRN

## 2018-11-25 MED ORDER — DEXAMETHASONE SODIUM PHOSPHATE 4 MG/ML IJ SOLN
INTRAMUSCULAR | Status: AC
  Filled 2018-11-25: qty 1

## 2018-11-25 MED ORDER — FENTANYL-BUPIVACAINE-NACL 0.5-0.125-0.9 MG/250ML-% EP SOLN
EPIDURAL | Status: AC
  Filled 2018-11-25: qty 250

## 2018-11-25 MED ORDER — MORPHINE SULFATE (PF) 0.5 MG/ML IJ SOLN
INTRAMUSCULAR | Status: DC | PRN
  Administered 2018-11-25: 3 mg via EPIDURAL

## 2018-11-25 MED ORDER — SODIUM CHLORIDE 0.9 % IR SOLN
Status: DC | PRN
  Administered 2018-11-25: 1

## 2018-11-25 MED ORDER — FENTANYL-BUPIVACAINE-NACL 0.5-0.125-0.9 MG/250ML-% EP SOLN: 12.0000 mL/h | EPIDURAL | Status: DC | PRN

## 2018-11-25 MED ORDER — SOD CITRATE-CITRIC ACID 500-334 MG/5ML PO SOLN
30.0000 mL | ORAL | Status: DC | PRN
  Administered 2018-11-25: 22:00:00 30 mL via ORAL
  Filled 2018-11-25: qty 30

## 2018-11-25 MED ORDER — DEXAMETHASONE SODIUM PHOSPHATE 4 MG/ML IJ SOLN
INTRAMUSCULAR | Status: DC | PRN
  Administered 2018-11-25: 4 mg via INTRAVENOUS

## 2018-11-25 MED ORDER — OXYCODONE-ACETAMINOPHEN 5-325 MG PO TABS: 1.0000 | ORAL_TABLET | ORAL | Status: DC | PRN

## 2018-11-25 MED ORDER — MORPHINE SULFATE (PF) 0.5 MG/ML IJ SOLN
INTRAMUSCULAR | Status: AC
  Filled 2018-11-25: qty 10

## 2018-11-25 MED ORDER — SODIUM CHLORIDE 0.9 % IV SOLN
INTRAVENOUS | Status: DC | PRN
  Administered 2018-11-25: 23:00:00 via INTRAVENOUS

## 2018-11-25 MED ORDER — PHENYLEPHRINE 40 MCG/ML (10ML) SYRINGE FOR IV PUSH (FOR BLOOD PRESSURE SUPPORT)
PREFILLED_SYRINGE | INTRAVENOUS | Status: AC
  Filled 2018-11-25: qty 20

## 2018-11-25 MED ORDER — LACTATED RINGERS AMNIOINFUSION
INTRAVENOUS | Status: DC
  Administered 2018-11-25: 18:00:00 via INTRAUTERINE

## 2018-11-25 MED ORDER — LACTATED RINGERS IV SOLN
INTRAVENOUS | Status: DC
  Administered 2018-11-25 (×4): via INTRAVENOUS

## 2018-11-25 MED ORDER — EPHEDRINE 5 MG/ML INJ: 10.0000 mg | INTRAVENOUS | Status: DC | PRN

## 2018-11-25 MED ORDER — TERBUTALINE SULFATE 1 MG/ML IJ SOLN
0.2500 mg | Freq: Once | INTRAMUSCULAR | Status: AC
  Administered 2018-11-25: 0.25 mg via SUBCUTANEOUS

## 2018-11-25 MED ORDER — LACTATED RINGERS IV SOLN
500.0000 mL | INTRAVENOUS | Status: DC | PRN
  Administered 2018-11-25: 19:00:00 500 mL via INTRAVENOUS

## 2018-11-25 MED ORDER — ONDANSETRON HCL 4 MG/2ML IJ SOLN: 4.0000 mg | Freq: Four times a day (QID) | INTRAMUSCULAR | Status: DC | PRN

## 2018-11-25 MED ORDER — SODIUM CHLORIDE (PF) 0.9 % IJ SOLN
INTRAMUSCULAR | Status: DC | PRN
  Administered 2018-11-25: 12 mL/h via EPIDURAL

## 2018-11-25 MED ORDER — CEFAZOLIN SODIUM-DEXTROSE 2-4 GM/100ML-% IV SOLN: 2.0000 g | Freq: Three times a day (TID) | INTRAVENOUS | Status: DC

## 2018-11-25 MED ORDER — ONDANSETRON HCL 4 MG/2ML IJ SOLN
INTRAMUSCULAR | Status: DC | PRN
  Administered 2018-11-25: 4 mg via INTRAVENOUS

## 2018-11-25 MED ORDER — SCOPOLAMINE 1 MG/3DAYS TD PT72: 1.0000 | MEDICATED_PATCH | Freq: Once | TRANSDERMAL | Status: DC

## 2018-11-25 MED ORDER — CEFAZOLIN SODIUM-DEXTROSE 2-3 GM-%(50ML) IV SOLR
INTRAVENOUS | Status: DC | PRN
  Administered 2018-11-25: 2 g via INTRAVENOUS

## 2018-11-25 MED ORDER — DIPHENHYDRAMINE HCL 50 MG/ML IJ SOLN: 12.5000 mg | INTRAMUSCULAR | Status: DC | PRN

## 2018-11-25 MED ORDER — LIDOCAINE-EPINEPHRINE (PF) 2 %-1:200000 IJ SOLN
INTRAMUSCULAR | Status: DC | PRN
  Administered 2018-11-25 (×4): 5 mL via EPIDURAL

## 2018-11-25 MED ORDER — EPHEDRINE 5 MG/ML INJ
10.0000 mg | INTRAVENOUS | Status: DC | PRN
  Filled 2018-11-25: qty 10

## 2018-11-25 MED ORDER — LACTATED RINGERS IV SOLN: 500.0000 mL | Freq: Once | INTRAVENOUS | Status: DC

## 2018-11-25 MED ORDER — PHENYLEPHRINE 40 MCG/ML (10ML) SYRINGE FOR IV PUSH (FOR BLOOD PRESSURE SUPPORT)
80.0000 ug | PREFILLED_SYRINGE | INTRAVENOUS | Status: DC | PRN
  Administered 2018-11-25: 80 ug via INTRAVENOUS

## 2018-11-25 SURGICAL SUPPLY — 36 items
BENZOIN TINCTURE PRP APPL 2/3 (GAUZE/BANDAGES/DRESSINGS) ×2 IMPLANT
CHLORAPREP W/TINT 26ML (MISCELLANEOUS) ×2 IMPLANT
CLAMP CORD UMBIL (MISCELLANEOUS) IMPLANT
CLOTH BEACON ORANGE TIMEOUT ST (SAFETY) ×2 IMPLANT
CLSR STERI-STRIP ANTIMIC 1/2X4 (GAUZE/BANDAGES/DRESSINGS) ×2 IMPLANT
DRAPE C SECTION CLR SCREEN (DRAPES) ×2 IMPLANT
DRSG OPSITE POSTOP 4X10 (GAUZE/BANDAGES/DRESSINGS) ×2 IMPLANT
ELECT REM PT RETURN 9FT ADLT (ELECTROSURGICAL) ×2
ELECTRODE REM PT RTRN 9FT ADLT (ELECTROSURGICAL) ×1 IMPLANT
EXTRACTOR VACUUM KIWI (MISCELLANEOUS) IMPLANT
GLOVE BIO SURGEON STRL SZ 6.5 (GLOVE) ×2 IMPLANT
GLOVE BIOGEL PI IND STRL 7.0 (GLOVE) ×2 IMPLANT
GLOVE BIOGEL PI INDICATOR 7.0 (GLOVE) ×2
GOWN STRL REUS W/TWL LRG LVL3 (GOWN DISPOSABLE) ×4 IMPLANT
KIT ABG SYR 3ML LUER SLIP (SYRINGE) IMPLANT
NEEDLE HYPO 25X5/8 SAFETYGLIDE (NEEDLE) IMPLANT
NS IRRIG 1000ML POUR BTL (IV SOLUTION) ×2 IMPLANT
PACK C SECTION WH (CUSTOM PROCEDURE TRAY) ×2 IMPLANT
PAD OB MATERNITY 4.3X12.25 (PERSONAL CARE ITEMS) ×2 IMPLANT
RETRACTOR WND ALEXIS 25 LRG (MISCELLANEOUS) ×2 IMPLANT
RTRCTR C-SECT PINK 25CM LRG (MISCELLANEOUS) IMPLANT
RTRCTR WOUND ALEXIS 25CM LRG (MISCELLANEOUS) ×4
STRIP CLOSURE SKIN 1/2X4 (GAUZE/BANDAGES/DRESSINGS) IMPLANT
SUT CHROMIC 1 CTX 36 (SUTURE) ×4 IMPLANT
SUT PLAIN 0 NONE (SUTURE) IMPLANT
SUT PLAIN 2 0 XLH (SUTURE) ×2 IMPLANT
SUT VIC AB 0 CT1 27 (SUTURE) ×2
SUT VIC AB 0 CT1 27XBRD ANBCTR (SUTURE) ×2 IMPLANT
SUT VIC AB 2-0 CT1 27 (SUTURE) ×1
SUT VIC AB 2-0 CT1 TAPERPNT 27 (SUTURE) ×1 IMPLANT
SUT VIC AB 3-0 CT1 27 (SUTURE)
SUT VIC AB 3-0 CT1 TAPERPNT 27 (SUTURE) IMPLANT
SUT VIC AB 4-0 KS 27 (SUTURE) ×2 IMPLANT
TOWEL OR 17X24 6PK STRL BLUE (TOWEL DISPOSABLE) ×2 IMPLANT
TRAY FOLEY W/BAG SLVR 14FR LF (SET/KITS/TRAYS/PACK) ×2 IMPLANT
WATER STERILE IRR 1000ML POUR (IV SOLUTION) ×2 IMPLANT

## 2018-11-25 NOTE — MAU Note (Signed)
Pt reports to MAU c/o ctx every 29min. No bleeding or LOF. +FM. Last exam she was 1cm.

## 2018-11-25 NOTE — H&P (Signed)
Kara Landry is a 28 y.[id number] female presenting at 30 6/7wks for painful contractions. Pt noted to have made cervical change from 1 to 3cm dilation. Pt is dated per LMP; confirmed with a 9 week [place]. Her pregnancy has been uncomplicated. She has a history of l salpingectomy from ectopic. Rh neg - received rhogam. She is GBS neg. Sars Covid  pending OB History    Gravida  2   Para  0   Term  0   Preterm  0   AB  1   Living  0     SAB  0   TAB  0   Ectopic  1   Multiple  0   Live Births  0          Past Medical History:  Diagnosis Date  . Anxiety   . Headache   . History of chicken pox 1996  . Infection    UTI  . Medical history non-contributory    Past Surgical History:  Procedure Laterality Date  . LAPAROSCOPY Left 08/20/2017   Procedure: LAPAROSCOPY OPERATIVE with treatment of left ectopic pregnancy;  Surgeon: Salvadore Dom, MD;  Location: [facility];  Service: Gynecology;  Laterality: Left;  and removal of left paratubal cyst   . NO PAST SURGERIES    . SALPINGECTOMY    . WISDOM TOOTH EXTRACTION     Family History: family history includes Breast cancer in an other family member; Cancer (age of onset: 51) in her maternal aunt and paternal aunt; Depression in her paternal aunt and another family member; Heart disease in her maternal grandfather and another family member; Osteoporosis in her mother and another family member. Social History:  reports that she has never smoked. She has never used smokeless tobacco. She reports that she does not drink alcohol or use drugs.     Maternal Diabetes: No Genetic Screening: Normal Maternal Ultrasounds/Referrals: Normal Fetal Ultrasounds or other Referrals:  None Maternal Substance Abuse:  No Significant Maternal Medications:  None Significant Maternal Lab Results:  Group B Strep negative Other Comments:  None  ROS History Dilation: 3.5 Effacement (%): 70 Station: -2 Exam by:: TLYTLE RN  Blood pressure  94/78, pulse (!) 102, temperature 99.4 F (37.4 C), temperature source Oral, weight 69.1 kg, last menstrual period 02/19/2018, SpO2 98 %. Exam Physical Exam  Prenatal labs: ABO, Rh: --/--/PENDING (08/25 0820) Antibody: PENDING (08/25 0820) Rubella: Immune (01/28 0000) RPR: Nonreactive (01/28 0000)  HBsAg: Negative (01/28 0000)  HIV: Non-reactive (01/28 0000)  GBS: Negative (07/31 0000)   Assessment/Plan: 28yo G2P0010 female at 85 6/7wks in active labor Admit Pain control prn; pending Covid results GBS neg Anticipate svd   Kara Landry Kara Landry 11/25/2018, 10:07 AM

## 2018-11-25 NOTE — Transfer of Care (Signed)
Immediate Anesthesia Transfer of Care Note  Patient: Kara Landry  Procedure(s) Performed: CESAREAN SECTION (N/A )  Patient Location: PACU  Anesthesia Type:Epidural  Level of Consciousness: awake, alert , oriented and patient cooperative  Airway & Oxygen Therapy: Patient Spontanous Breathing  Post-op Assessment: Report given to RN and Post -op Vital signs reviewed and stable  Post vital signs: Reviewed and stable  Last Vitals:  Vitals Value Taken Time  BP    Temp    Pulse 135 11/25/18 2326  Resp 18 11/25/18 2326  SpO2 100 % 11/25/18 2326  Vitals shown include unvalidated device data.  Last Pain:  Vitals:   11/25/18 1940  TempSrc: Axillary  PainSc:       Patients Stated Pain Goal: 3 ([id number])  Complications: No apparent anesthesia complications

## 2018-11-25 NOTE — Anesthesia Preprocedure Evaluation (Signed)
Anesthesia Evaluation  Patient identified by MRN, date of birth, ID band Patient awake    Reviewed: Allergy & Precautions, NPO status , Patient's Chart, lab work & pertinent test results  Airway Mallampati: II  TM Distance: >3 FB Neck ROM: Full    Dental no notable dental hx.    Pulmonary neg pulmonary ROS,    Pulmonary exam normal breath sounds clear to auscultation       Cardiovascular negative cardio ROS Normal cardiovascular exam Rhythm:Regular Rate:Normal     Neuro/Psych  Headaches, Anxiety negative neurological ROS  negative psych ROS   GI/Hepatic negative GI ROS, Neg liver ROS,   Endo/Other  negative endocrine ROS  Renal/GU negative Renal ROS  negative genitourinary   Musculoskeletal negative musculoskeletal ROS (+)   Abdominal   Peds negative pediatric ROS (+)  Hematology negative hematology ROS (+)   Anesthesia Other Findings   Reproductive/Obstetrics (+) Pregnancy                             Anesthesia Physical  Anesthesia Plan  ASA: II  Anesthesia Plan: Epidural   Post-op Pain Management:    Induction:   PONV Risk Score and Plan: 2 and Treatment may vary due to age or medical condition  Airway Management Planned: Natural Airway  Additional Equipment:   Intra-op Plan:   Post-operative Plan:   Informed Consent: I have reviewed the patients History and Physical, chart, labs and discussed the procedure including the risks, benefits and alternatives for the proposed anesthesia with the patient or authorized representative who has indicated his/her understanding and acceptance.       Plan Discussed with:   Anesthesia Plan Comments:         Anesthesia Quick Evaluation

## 2018-11-25 NOTE — Progress Notes (Signed)
Patient ID: Kara Landry, female   DOB: 1990-11-11, 28 y.o.   MRN: [id number] Pt has been on hands and knees for past 75mins after had a prolonged decel for approximately 78minutes.  VSS EFM - 150s, cat 1 TOCO - contractions q 40mins SVE - c/c/0  A/P: Prime completely dilated; zero station         Will start pushing; will try in hands and knees         Plan for svd

## 2018-11-25 NOTE — Progress Notes (Signed)
Patient ID: Kara Landry, female   DOB: [birth year], 28 y.o.   MRN: [id number]  Attempted pushing with no descent. Pt placed in various positions to labor down. After 58mins attempted pushing again. No descent. Marked variability noted with variable decels and some late decels. Advised pt to proceed with cesarean section for nonreassuring fetal heart tones ( cat 3) - remote from delivery.  Reviewed risks/benefits of cesarean section and implications for future labor.  Staff notified  Proceed to OR

## 2018-11-25 NOTE — Op Note (Signed)
Operative Note    Preoperative Diagnosis: IUP at term                                             Cat 3 strip                                             Remote from delivery   Postoperative Diagnosis: Same                                               Cephalopelvic Disproportion   Procedure: Primary low transverse cesarean section with double layered closure   Surgeon: Mickle Mallory DO  Anesthesia: Epidural  Fluids: LR 2L EBL: 461ml UOP: 146ml   Findings: Viable female infant in vertex position, cord around chest. Grossly normal uterus, tubes and ovaries. Apgars 9,9, Weight pending   Specimen: Placenta to pathology   Procedure Note Patient was taken to the operating room where epidural anesthesia was found to be adequate. She was prepped and draped in the normal sterile fashion in the dorsal supine position with a leftward tilt. An appropriate time out was performed. Allis clamp test confirmed adequate anesthesia. A Pfannenstiel skin incision was then made with the scalpel and carried through to the underlying layer of fascia by sharp dissection and Bovie cautery. The fascia was nicked in the midline and the incision was extended laterally with Mayo scissors. The superior and inferior aspects of the incision were grasped dissected off the underlying rectus muscles.  Rectus muscles were separated in the midline and the peritoneal cavity entered bluntly. The Alexis self-retaining wound retractor was then placed  The bladder flap was developed with Metzenbaum scissors and pushed away from the lower uterine segment. The lower uterine segment was then incised in a transverse fashion and the cavity itself entered bluntly.  The infant's head was then lifted and delivered from the incision without difficulty. Cord noted around front of chest. The remainder of the infant delivered and the nose and mouth bulb suctioned . The cord was cut after a minute delay. The infant was handed off to the waiting  pediatricians. The placenta was then spontaneously expressed from the uterus and the uterus cleared of all clots and debris with moist lap sponge. The uterine incision was then repaired in 2 layers the first layer was a running locked layer 1-0 chromic and the second an imbricating layer of the same suture. The tubes and ovaries were inspected and the gutters cleared of all clots and debris. The uterine incision was inspected and found to be hemostatic. All instruments and sponges as well as the Alexis retractor were then removed from the abdomen. The rectus muscles and peritoneum were then reapproximated with in a running fashion with sutures of 2-0 Vicryl. The fascia was then closed with 0 Vicryl in a running fashion. Subcutaneous tissue was reapproximated with 3-0 plain in a running fashion. The skin was closed with a subcuticular stitch of 4-0 Vicryl on a Keith needle and then reinforced with benzoin and Steri-Strips. At the conclusion of the procedure all instruments and sponge counts were correct. Patient was taken  to the recovery room in good condition with her baby accompanying her skin to skin.

## 2018-11-25 NOTE — Progress Notes (Signed)
Patient ID: Kara Landry, female   DOB: Aug 12, 1990, 28 y.o.   MRN: [id number] FHTs - cat 1, 145 for past 1.5hrs with pt in high fowlers.  Contractions q 4-20mins; inadequate mvus Pt with no complaints Continue with expectant mgmt Recheck in an hour and start pitocin as indicated

## 2018-11-25 NOTE — Progress Notes (Signed)
Patient ID: Kara Landry, female   DOB: Jul 27, 1990, 28 y.o.   MRN: [id number] Pt comfortable with epidural. Feels well. Had variable decelerations after epidural. BP dropped to 103/67. Terbutaline given - improved  VS -135/74 GEN - NAD EFM - 145, cat 1 TOCO - contractions q 1-81mins SVE - 6/90/-1  A/P: Progressing well in labor         Continue with expectant mgmt

## 2018-11-25 NOTE — Anesthesia Procedure Notes (Signed)
Epidural Patient location during procedure: OB Start time: 11/25/2018 11:23 AM End time: 11/25/2018 11:38 AM  Staffing Anesthesiologist: Lynda Rainwater, MD Performed: anesthesiologist   Preanesthetic Checklist Completed: patient identified, site marked, surgical consent, pre-op evaluation, timeout performed, IV checked, risks and benefits discussed and monitors and equipment checked  Epidural Patient position: sitting Prep: ChloraPrep Patient monitoring: heart rate, cardiac monitor, continuous pulse ox and blood pressure Approach: midline Location: L2-L3 Injection technique: LOR saline  Needle:  Needle type: Tuohy  Needle gauge: 17 G Needle length: 9 cm Needle insertion depth: 4 cm Catheter type: closed end flexible Catheter size: 20 Guage Catheter at skin depth: 8 cm Test dose: negative  Assessment Events: blood not aspirated, injection not painful, no injection resistance, negative IV test and no paresthesia  Additional Notes Reason for block:procedure for pain

## 2018-11-26 ENCOUNTER — Other Ambulatory Visit (HOSPITAL_COMMUNITY)
Admission: RE | Admit: 2018-11-26 | Discharge: 2018-11-26 | Disposition: A | Discharge status: Home / Self Care | Payer: 59 | Source: Ambulatory Visit | Attending: Obstetrics and Gynecology | Admitting: Obstetrics and Gynecology

## 2018-11-26 ENCOUNTER — Encounter (HOSPITAL_COMMUNITY): Payer: Self-pay

## 2018-11-26 LAB — CBC
HCT: 27.8 % — ABNORMAL LOW (ref 36.0–46.0)
Hemoglobin: 8.6 g/dL — ABNORMAL LOW (ref 12.0–15.0)
MCH: 25.4 pg — ABNORMAL LOW (ref 26.0–34.0)
MCHC: 30.9 g/dL (ref 30.0–36.0)
MCV: 82 fL (ref 80.0–100.0)
Platelets: 251 10*3/uL (ref 150–400)
RBC: 3.39 MIL/uL — ABNORMAL LOW (ref 3.87–5.11)
RDW: 15.5 % (ref 11.5–15.5)
WBC: 20.2 10*3/uL — ABNORMAL HIGH (ref 4.0–10.5)
nRBC: 0 % (ref 0.0–0.2)

## 2018-11-26 MED ORDER — PRENATAL MULTIVITAMIN CH
1.0000 | ORAL_TABLET | Freq: Every day | ORAL | Status: DC
  Administered 2018-11-26 – 2018-11-27 (×2): 1 via ORAL
  Filled 2018-11-26 (×2): qty 1

## 2018-11-26 MED ORDER — RHO D IMMUNE GLOBULIN 1500 UNIT/2ML IJ SOSY
300.0000 ug | PREFILLED_SYRINGE | Freq: Once | INTRAMUSCULAR | Status: AC
  Administered 2018-11-26: 300 ug via INTRAVENOUS
  Filled 2018-11-26: qty 2

## 2018-11-26 MED ORDER — KETOROLAC TROMETHAMINE 30 MG/ML IJ SOLN
INTRAMUSCULAR | Status: AC
  Filled 2018-11-26: qty 1

## 2018-11-26 MED ORDER — COCONUT OIL OIL: 1.0000 "application " | TOPICAL_OIL | Status: DC | PRN

## 2018-11-26 MED ORDER — SODIUM CHLORIDE 0.9% FLUSH: 3.0000 mL | INTRAVENOUS | Status: DC | PRN

## 2018-11-26 MED ORDER — KETOROLAC TROMETHAMINE 30 MG/ML IJ SOLN
30.0000 mg | Freq: Once | INTRAMUSCULAR | Status: AC | PRN
  Administered 2018-11-26: 30 mg via INTRAVENOUS

## 2018-11-26 MED ORDER — NALBUPHINE HCL 10 MG/ML IJ SOLN: 5.0000 mg | Freq: Once | INTRAMUSCULAR | Status: DC | PRN

## 2018-11-26 MED ORDER — OXYCODONE HCL 5 MG PO TABS: 5.0000 mg | ORAL_TABLET | ORAL | Status: DC | PRN

## 2018-11-26 MED ORDER — WITCH HAZEL-GLYCERIN EX PADS: 1.0000 "application " | MEDICATED_PAD | CUTANEOUS | Status: DC | PRN

## 2018-11-26 MED ORDER — SIMETHICONE 80 MG PO CHEW
80.0000 mg | CHEWABLE_TABLET | ORAL | Status: DC
  Administered 2018-11-26 – 2018-11-27 (×2): 80 mg via ORAL
  Filled 2018-11-26 (×2): qty 1

## 2018-11-26 MED ORDER — SCOPOLAMINE 1 MG/3DAYS TD PT72: 1.0000 | MEDICATED_PATCH | Freq: Once | TRANSDERMAL | Status: DC

## 2018-11-26 MED ORDER — NALBUPHINE HCL 10 MG/ML IJ SOLN: 5.0000 mg | INTRAMUSCULAR | Status: DC | PRN

## 2018-11-26 MED ORDER — NALOXONE HCL 4 MG/10ML IJ SOLN
1.0000 ug/kg/h | INTRAVENOUS | Status: DC | PRN
  Filled 2018-11-26: qty 5

## 2018-11-26 MED ORDER — MENTHOL 3 MG MT LOZG: 1.0000 | LOZENGE | OROMUCOSAL | Status: DC | PRN

## 2018-11-26 MED ORDER — ACETAMINOPHEN 500 MG PO TABS
1000.0000 mg | ORAL_TABLET | Freq: Four times a day (QID) | ORAL | Status: AC
  Administered 2018-11-26 – 2018-11-27 (×4): 1000 mg via ORAL
  Filled 2018-11-26 (×4): qty 2

## 2018-11-26 MED ORDER — KETOROLAC TROMETHAMINE 30 MG/ML IJ SOLN: 30.0000 mg | Freq: Four times a day (QID) | INTRAMUSCULAR | Status: AC | PRN

## 2018-11-26 MED ORDER — SIMETHICONE 80 MG PO CHEW
80.0000 mg | CHEWABLE_TABLET | Freq: Three times a day (TID) | ORAL | Status: DC
  Administered 2018-11-26 – 2018-11-27 (×6): 80 mg via ORAL
  Filled 2018-11-26 (×6): qty 1

## 2018-11-26 MED ORDER — TETANUS-DIPHTH-ACELL PERTUSSIS 5-2.5-18.5 LF-MCG/0.5 IM SUSP: 0.5000 mL | Freq: Once | INTRAMUSCULAR | Status: DC

## 2018-11-26 MED ORDER — DIPHENHYDRAMINE HCL 25 MG PO CAPS: 25.0000 mg | ORAL_CAPSULE | ORAL | Status: DC | PRN

## 2018-11-26 MED ORDER — DIBUCAINE (PERIANAL) 1 % EX OINT: 1.0000 "application " | TOPICAL_OINTMENT | CUTANEOUS | Status: DC | PRN

## 2018-11-26 MED ORDER — OXYTOCIN 40 UNITS IN NORMAL SALINE INFUSION - SIMPLE MED: 2.5000 [IU]/h | INTRAVENOUS | Status: AC

## 2018-11-26 MED ORDER — DIPHENHYDRAMINE HCL 50 MG/ML IJ SOLN: 12.5000 mg | INTRAMUSCULAR | Status: DC | PRN

## 2018-11-26 MED ORDER — SENNOSIDES-DOCUSATE SODIUM 8.6-50 MG PO TABS
2.0000 | ORAL_TABLET | ORAL | Status: DC
  Administered 2018-11-26 – 2018-11-27 (×2): 2 via ORAL
  Filled 2018-11-26 (×2): qty 2

## 2018-11-26 MED ORDER — NALOXONE HCL 0.4 MG/ML IJ SOLN: 0.4000 mg | INTRAMUSCULAR | Status: DC | PRN

## 2018-11-26 MED ORDER — SIMETHICONE 80 MG PO CHEW: 80.0000 mg | CHEWABLE_TABLET | ORAL | Status: DC | PRN

## 2018-11-26 MED ORDER — ONDANSETRON HCL 4 MG/2ML IJ SOLN: 4.0000 mg | Freq: Three times a day (TID) | INTRAMUSCULAR | Status: DC | PRN

## 2018-11-26 MED ORDER — ZOLPIDEM TARTRATE 5 MG PO TABS: 5.0000 mg | ORAL_TABLET | Freq: Every evening | ORAL | Status: DC | PRN

## 2018-11-26 MED ORDER — DIPHENHYDRAMINE HCL 25 MG PO CAPS: 25.0000 mg | ORAL_CAPSULE | Freq: Four times a day (QID) | ORAL | Status: DC | PRN

## 2018-11-26 MED ORDER — IBUPROFEN 800 MG PO TABS
800.0000 mg | ORAL_TABLET | Freq: Three times a day (TID) | ORAL | Status: DC
  Administered 2018-11-26 – 2018-11-27 (×6): 800 mg via ORAL
  Filled 2018-11-26 (×6): qty 1

## 2018-11-26 MED ORDER — LACTATED RINGERS IV SOLN
INTRAVENOUS | Status: DC
  Administered 2018-11-26: 09:00:00 via INTRAVENOUS

## 2018-11-26 NOTE — Progress Notes (Signed)
Subjective: Postpartum Day 1: Cesarean Delivery Patient reports tolerating PO and + flatus.  Minimal flatus per patient, has not ambulated much 2/2 early POD#1, Foley in place. Pain well-controlled with PO ibuprofen/Tylenol while sitting, worse when standing.   Objective: Vital signs in last 24 hours: Temp:  [98.5 F (36.9 C)-101.3 F (38.5 C)] 98.5 F (36.9 C) (08/26 0800) Pulse Rate:  [75-141] 111 (08/26 0800) Resp:  [18-24] 18 (08/26 0800) BP: (100-144)/(57-101) 124/73 (08/26 0800) SpO2:  [96 %-100 %] 98 % (08/26 0800) Weight:  [69.1 kg] 69.1 kg (08/25 1135)  Physical Exam:  General: alert, no distress and appears tired, holding infant Lochia: appropriate Uterine Fundus: firm. Mild distention in lower abdomen Incision: healing well, no significant drainage, honeycomb intact DVT Evaluation: No evidence of DVT seen on physical exam.  Recent Labs    11/25/18 0808 11/26/18 0623  HGB 11.1* 8.6*  HCT 35.8* 27.8*    Assessment/Plan: Status post Cesarean section. Doing well postoperatively.  Continue current care.  28yo [id number] POD#1 s/p PLTCS for NRFS remote from delivery at 39 6/7. Doing well POD#1.  -Continue PO pain meds -D/C Foley and pend spontaneous void -Encourage ambulation and incentive spirometry -Anticipate DC home POD2-3  Melida Quitter Kace Hartje 11/26/2018, 11:05 AM

## 2018-11-26 NOTE — Plan of Care (Signed)
Pt condition will continue to improve

## 2018-11-26 NOTE — Plan of Care (Signed)
Pt. Condition will continue to improve 

## 2018-11-26 NOTE — Progress Notes (Signed)
Patient doing well this afternoon. Foley d/c approx 1530, pending spontaneous void. Baby has voided, still struggling some with proper latch and feeding. Maternal request for AM circ at this time. Will plan appropriately

## 2018-11-26 NOTE — Anesthesia Postprocedure Evaluation (Signed)
Anesthesia Post Note  Patient: Kara Landry  Procedure(s) Performed: CESAREAN SECTION (N/A )     Patient location during evaluation: Mother Baby Anesthesia Type: Epidural Level of consciousness: awake and alert Pain management: pain level controlled Vital Signs Assessment: post-procedure vital signs reviewed and stable Respiratory status: spontaneous breathing, nonlabored ventilation and respiratory function stable Cardiovascular status: stable Postop Assessment: no headache, no backache and epidural receding Anesthetic complications: no    Last Vitals:  Vitals:   11/26/18 0500 11/26/18 0630  BP:    Pulse:    Resp:    Temp:    SpO2: 97% 97%    Last Pain:  Vitals:   11/26/18 0630  TempSrc:   PainSc: 5    Pain Goal: Patients Stated Pain Goal: 2 (11/26/18 0630)                 Barkley Boards

## 2018-11-26 NOTE — Anesthesia Postprocedure Evaluation (Signed)
Anesthesia Post Note  Patient: DAWSYN EHRHARD  Procedure(s) Performed: CESAREAN SECTION (N/A )     Patient location during evaluation: PACU Anesthesia Type: Epidural Level of consciousness: awake and alert Pain management: pain level controlled Vital Signs Assessment: post-procedure vital signs reviewed and stable Respiratory status: spontaneous breathing, nonlabored ventilation and respiratory function stable Cardiovascular status: stable Postop Assessment: no headache, no backache and epidural receding Anesthetic complications: no    Last Vitals:  Vitals:   11/26/18 0300 11/26/18 0400  BP: 122/80 110/71  Pulse:    Resp: 20 20  Temp: 37.4 C 37.4 C  SpO2: 99% 99%    Last Pain:  Vitals:   11/26/18 0400  TempSrc:   PainSc: 0-No pain   Pain Goal: Patients Stated Pain Goal: 3 (11/25/18 1044)                 Gabi Mcfate L Mekiah [place]

## 2018-11-26 NOTE — Addendum Note (Signed)
Addendum  created 11/26/18 0758 by Ignacia Bayley, CRNA   Clinical Note Signed

## 2018-11-26 NOTE — Lactation Note (Signed)
This note was copied from a baby's chart. Lactation Consultation Note  Patient Name: Kara Landry [id number] Date: 11/26/2018 Reason for consult: Initial assessment;1st time breastfeeding;Term P1, 5 hour female infant, DAT+ Infant had 2 stools since birth. Per mom, infant attempted to latch in L&D. Infant has breastfeed 4 or more times since in room. Per mom, she breastfeed infant 1 hour prior to [facility] entered the room. Mom is holding infant in her arms and infant is asleep at this time. Mom has DEBP at home. LC did not observed latch. Mom knows to breastfeed according hunger cues, 8 to 12 times within 24 hours and on demand. Reviewed Baby & Me book's Breastfeeding Basics.  Mom made aware of O/P services, breastfeeding support groups, community resources, and our phone # for post-discharge questions.  Maternal Data Formula Feeding for Exclusion: No Has patient been taught Hand Expression?: Yes Does the patient have breastfeeding experience prior to this delivery?: No  Feeding Feeding Type: Breast Fed  LATCH Score Latch: Repeated attempts needed to sustain latch, nipple held in mouth throughout feeding, stimulation needed to elicit sucking reflex.  Audible Swallowing: A few with stimulation  Type of Nipple: Everted at rest and after stimulation  Comfort (Breast/Nipple): Soft / non-tender  Hold (Positioning): Assistance needed to correctly position infant at breast and maintain latch.  LATCH Score: 7  Interventions Interventions: Breast feeding basics reviewed;Position options;Hand express  Lactation Tools Discussed/Used WIC Program: No   Consult Status Consult Status: Follow-up Date: 11/26/18 Follow-up type: In-patient    Vicente Serene 11/26/2018, 4:26 AM

## 2018-11-27 LAB — RH IG WORKUP (INCLUDES ABO/RH)
ABO/RH(D): A NEG
Fetal Screen: NEGATIVE
Gestational Age(Wks): 39.6
Unit division: 0

## 2018-11-27 NOTE — Progress Notes (Addendum)
Subjective: Postpartum Day 2: Cesarean Delivery Patient reports incisional pain, tolerating PO and no problems voiding.   Tolerating blood loss anemia well  Objective: Vital signs in last 24 hours: Temp:  [97.8 F (36.6 C)-98.6 F (37 C)] 98.6 F (37 C) (08/27 0539) Pulse Rate:  [88-111] 88 (08/27 0539) Resp:  [16-18] 16 (08/27 0539) BP: (103-124)/(69-86) 103/82 (08/27 0539) SpO2:  [98 %-99 %] 99 % (08/26 2034)  Physical Exam:  General: alert and no distress Lochia: appropriate Uterine Fundus: firm Incision: healing well DVT Evaluation: No evidence of DVT seen on physical exam.  Recent Labs    11/25/18 0808 11/26/18 0623  HGB 11.1* 8.6*  HCT 35.8* 27.8*    Assessment/Plan: Status post Cesarean section. Doing well postoperatively.  Continue current care. Circ at lunchtime  [place] 11/27/2018, 7:54 AM

## 2018-11-27 NOTE — Lactation Note (Addendum)
This note was copied from a baby's chart. Lactation Consultation Note  Patient Name: Kara Landry [id number] Date: 11/27/2018    "Marland Kitchen" had a circumcision this afternoon. A feeding at the breast was attempted during consult, but he was not interested. His last feeding was only 2 hrs ago.  Mom's nipples are pink & she notes some tenderness during the feeding. She denies any nipple shape distortion when infant releases latch. Specifics of an asymmetric latch were shown via [employer]. Mom reports that sometimes he seems too upset to latch. I suggested that parents get him to the breast with his earlier feeding cues.   Mom reports hearing swallows & gulping when infant is at breast. Parents were taught signs of satiety.  Mom knows how to call for Lactation assist while she is inpatient.   Infant is DAT+, but his bili levels have remained in the low risk zone.  Matthias Hughs [facility] 11/27/2018, 2:30 PM

## 2018-11-27 NOTE — Progress Notes (Signed)
Kara Landry was referred for history of depression/anxiety. * Referral screened out by Clinical Social Worker because none of the following criteria appear to apply: ~ History of anxiety/depression during this pregnancy, or of post-partum depression following prior delivery. ~ Diagnosis of anxiety and/or depression within last 3 years. Per further chart review, Kara Landry appears to have been diagnosed with anxiety in 2017. Also per [facility] records, Kara Landry was diagnosed with aniety in her "teenage years".  OR * Kara Landry's symptoms currently being treated with medication and/or therapy.   Kara Landry scored 5 on [place] with no concerns to CSW.       Virgie Dad Mettie Roylance, MSW, LCSW [facility] [facility] [place] [phone]

## 2018-11-28 ENCOUNTER — Inpatient Hospital Stay (HOSPITAL_COMMUNITY): Admission: AD | Admit: 2018-11-28 | Payer: 59 | Source: Home / Self Care

## 2018-11-28 ENCOUNTER — Inpatient Hospital Stay (HOSPITAL_COMMUNITY): Payer: 59

## 2018-11-28 MED ORDER — IBUPROFEN 800 MG PO TABS: 800.0000 mg | ORAL_TABLET | Freq: Three times a day (TID) | ORAL | 0 refills | Status: DC

## 2018-11-28 MED ORDER — OXYCODONE HCL 5 MG PO TABS: 5.0000 mg | ORAL_TABLET | ORAL | 0 refills | Status: DC | PRN

## 2018-11-28 NOTE — Discharge Summary (Signed)
OB Discharge Summary     Patient Name: Kara Landry DOB: [birth year] MRN: [id number]  Date of admission: 11/25/2018 Delivering MD: Carlynn Purl [facility]   Date of discharge: 11/28/2018  Admitting diagnosis: 74.6WKS CTX Intrauterine pregnancy: [redacted]w[redacted]d     Secondary diagnosis:  Active Problems:   Normal labor and delivery   Status post primary low transverse cesarean section   Postpartum care following cesarean delivery      Discharge diagnosis: Term Pregnancy Delivered and FHR decelerations                                                                                                Hospital course:  Onset of Labor With Unplanned C/S  28 y.o. yo G2P1011 at [redacted]w[redacted]d was admitted in [place] on 11/25/2018. Patient had a labor course significant for FHR decels. Membrane Rupture Time/Date: 4:48 PM ,11/25/2018   The patient went for cesarean section due to FHR decels, and delivered a Viable infant,11/25/2018  Details of operation can be found in separate operative note. Patient had an uncomplicated postpartum course.  She is ambulating,tolerating a regular diet, passing flatus, and urinating well.  Patient is discharged home in stable condition 11/28/18.  Physical exam  Vitals:   11/27/18 1457 11/27/18 1938 11/27/18 2144 11/28/18 0520  BP: 116/83 115/83 (!) 126/92 127/90  Pulse: 92 85 (!) 120 (!) 104  Resp: 16 14  16   Temp: 98.4 F (36.9 C) 98.2 F (36.8 C) 98.1 F (36.7 C) 98.2 F (36.8 C)  TempSrc: Oral Oral Oral Oral  SpO2:  100% 100% 100%  Weight:      Height:       General: alert Lochia: appropriate Uterine Fundus: firm Incision: Healing well with no significant drainage  Labs: Lab Results  Component Value Date   WBC 20.2 (H) 11/26/2018   HGB 8.6 (L) 11/26/2018   HCT 27.8 (L) 11/26/2018   MCV 82.0 11/26/2018   PLT 251 11/26/2018   CMP Latest Ref Rng & Units 08/20/2017  Glucose 65 - 99 mg/dL -  BUN 6 - 20 mg/dL 12  Creatinine 0.44 - 1.00 mg/dL 0.63  Sodium 134  - 144 mmol/L -  Potassium 3.5 - 5.2 mmol/L -  Chloride 96 - 106 mmol/L -  CO2 20 - 29 mmol/L -  Calcium 8.7 - 10.2 mg/dL -  Total Protein 6.0 - 8.5 g/dL -  Total Bilirubin 0.0 - 1.2 mg/dL -  Alkaline Phos 39 - 117 IU/L -  AST 15 - 41 U/L 18  ALT 0 - 32 IU/L -    Discharge instruction: per After Visit Summary and "Baby and Me Booklet".  After visit meds:  Allergies as of 11/28/2018   No Known Allergies     Medication List    TAKE these medications   ibuprofen 800 MG tablet Commonly known as: ADVIL Take 1 tablet (800 mg total) by mouth every 8 (eight) hours.   multivitamin-prenatal 27-0.8 MG Tabs tablet Take 1 tablet by mouth daily at 12 noon.   oxyCODONE 5 MG immediate release tablet Commonly known as: Oxy IR/ROXICODONE Take  1 tablet (5 mg total) by mouth every 4 (four) hours as needed for severe pain.       Diet: routine diet  Activity: Advance as tolerated. Pelvic rest for 6 weeks.   Outpatient follow up:2 weeks  Newborn Data: Live born female  Birth Weight: 8 lb 2.5 oz (3700 g) APGAR: 8, 9  Newborn Delivery   Birth date/time: 11/25/2018 22:37:00 Delivery type: C-Section, Low Transverse Trial of labor: Yes C-section categorization: Primary      Baby Feeding: Breast Disposition:home with mother   11/28/2018 Clarene Duke, MD

## 2018-11-28 NOTE — Lactation Note (Addendum)
This note was copied from a baby's chart. Lactation Consultation Note  Patient Name: Kara Landry [id number] Date: 11/28/2018  Mom called for latch check. Infant's swallowing was frequent and clearly audible to the naked ear. I noted that infant's chin was retracting with jaw movement, so I assisted with alignment so that there was more space between infant's chin & chest. Once I did so, infant's lower jaw was able to move in a normal manner.  Swallows were verified by cervical auscultation & I put stethoscope in Mom's ears b/c the swallowing was so clear. (Infant had a suck:swallow ratio of 1:1). Mom was pleased.   I noted that Mom's breasts are fuller today than yesterday and she has excellent vein distribution. I assisted Mom with some other minor alignment issues.  Mom knows how to reach [place] post-discharge if needed. I have no concerns about this dyad.   Darrol Jump, PNP was given an update.   Matthias Hughs [facility] 11/28/2018, 9:13 AM

## 2018-11-28 NOTE — Progress Notes (Signed)
I offered support to [facility] after FOB, Tom, went to the ED unexpectedly. She was utilizing good coping skills and focusing on each of them just getting what they need to be okay.  They have family who live in [place] and whom they had to tell that they could not come to see their baby because of the pandemic since the family was not isolating. That has been a stress on them emotionally.  Salvatore shared about her ectopic pregnancy whom she very much views as her first child, Heath Lark. She is delighted to get to have Zihlman with her, but also admits that it is very overwhelming.  Gershon Mussel will get to have 3 weeks off after they get home and she is grateful for that time.  She has had anxiety in the past and is very aware of what to look for and what to do if she experiences PMAD. She was grateful for the support.  [place], Honor Pager, [phone] 11:47 AM

## 2018-11-28 NOTE — Lactation Note (Signed)
This note was copied from a baby's chart. Lactation Consultation Note  Patient Name: Kara Landry [id number] Date: 11/28/2018   Mom is comfortable with latch & she says she is hearing swallows & gulps when infant is at breast. Mom commented that infant's last stool was "very big."   Infant at 9% weight loss (but infant only lost 81g over the last 24 hours with 7 voids and 2 stools during the time frame between those 2 weights). Mom is amenable to having me return to observe a feeding. Mom instructed on how to call for me.   Darrol Jump, PNP was given the above update.  Mom reports that the pink nipples I referred to in yesterday's note are typical color for her. Mom reports that it is her areola that has lightened in color since the birth of the baby.  Matthias Hughs [facility] 11/28/2018, 8:08 AM

## 2018-11-28 NOTE — Progress Notes (Signed)
POD #3 LTCS Doing well, pain ok Afeb, VSS Abd- soft, fundus firm, incision intact D/c home

## 2018-11-28 NOTE — Discharge Instructions (Signed)
As per discharge pamphlet °

## 2018-11-29 LAB — TYPE AND SCREEN
ABO/RH(D): A NEG
Antibody Screen: POSITIVE
Unit division: 0
Unit division: 0

## 2018-11-29 LAB — BPAM RBC
Blood Product Expiration Date: 202009182359
Blood Product Expiration Date: 202009192359
Unit Type and Rh: 600
Unit Type and Rh: 600

## 2019-06-06 ENCOUNTER — Ambulatory Visit: Payer: 59 | Attending: Internal Medicine

## 2019-06-06 DIAGNOSIS — Z23 Encounter for immunization: Secondary | ICD-10-CM | POA: Insufficient documentation

## 2019-06-06 NOTE — Progress Notes (Signed)
   Covid-19 Vaccination Clinic  Name:  Kara Landry    MRN: [id number] DOB: Jan 01, 1991  06/06/2019  [name] was observed post Covid-19 immunization for 15 minutes without incident. She was provided with Vaccine Information Sheet and instruction to access the V-Safe system.   [name] was instructed to call 911 with any severe reactions post vaccine: Marland Kitchen Difficulty breathing  . Swelling of face and throat  . A fast heartbeat  . A bad rash all over body  . Dizziness and weakness   Immunizations Administered    Name Date Dose VIS Date Route   Pfizer COVID-19 Vaccine 06/06/2019  2:37 PM 0.3 mL 03/13/2019 Intramuscular   Manufacturer: Delta   Lot: [id number]   [place]: [id number]

## 2019-06-27 ENCOUNTER — Ambulatory Visit: Payer: 59 | Attending: Internal Medicine

## 2019-06-27 DIAGNOSIS — Z23 Encounter for immunization: Secondary | ICD-10-CM

## 2019-06-27 NOTE — Progress Notes (Signed)
   Covid-19 Vaccination Clinic  Name:  Kara Landry    MRN: [id number] DOB: 01/15/1991  06/27/2019  [name] was observed post Covid-19 immunization for 15 minutes without incident. She was provided with Vaccine Information Sheet and instruction to access the V-Safe system.   [name] was instructed to call 911 with any severe reactions post vaccine: Marland Kitchen Difficulty breathing  . Swelling of face and throat  . A fast heartbeat  . A bad rash all over body  . Dizziness and weakness   Immunizations Administered    Name Date Dose VIS Date Route   Pfizer COVID-19 Vaccine 06/27/2019  9:08 AM 0.3 mL 03/13/2019 Intramuscular   Manufacturer: Crisp   Lot: [id number]   [place]: [id number]

## 2020-04-25 ENCOUNTER — Encounter: Payer: Self-pay | Admitting: Obstetrics and Gynecology

## 2020-04-25 ENCOUNTER — Other Ambulatory Visit: Payer: Self-pay

## 2020-04-25 ENCOUNTER — Ambulatory Visit (INDEPENDENT_AMBULATORY_CARE_PROVIDER_SITE_OTHER): Payer: 59 | Admitting: Obstetrics and Gynecology

## 2020-04-25 ENCOUNTER — Other Ambulatory Visit (HOSPITAL_COMMUNITY)
Admission: RE | Admit: 2020-04-25 | Discharge: 2020-04-25 | Disposition: A | Discharge status: Home / Self Care | Payer: 59 | Source: Ambulatory Visit | Attending: Obstetrics and Gynecology | Admitting: Obstetrics and Gynecology

## 2020-04-25 VITALS — BP 128/80 | HR 124 | Ht 62.25 in | Wt 103.0 lb

## 2020-04-25 DIAGNOSIS — Z124 Encounter for screening for malignant neoplasm of cervix: Secondary | ICD-10-CM | POA: Diagnosis present

## 2020-04-25 DIAGNOSIS — Z01419 Encounter for gynecological examination (general) (routine) without abnormal findings: Secondary | ICD-10-CM | POA: Diagnosis not present

## 2020-04-25 DIAGNOSIS — R5383 Other fatigue: Secondary | ICD-10-CM

## 2020-04-25 DIAGNOSIS — Z0000 Encounter for general adult medical examination without abnormal findings: Secondary | ICD-10-CM

## 2020-04-25 DIAGNOSIS — E559 Vitamin D deficiency, unspecified: Secondary | ICD-10-CM | POA: Diagnosis not present

## 2020-04-25 DIAGNOSIS — Z3009 Encounter for other general counseling and advice on contraception: Secondary | ICD-10-CM

## 2020-04-25 DIAGNOSIS — Z862 Personal history of diseases of the blood and blood-forming organs and certain disorders involving the immune mechanism: Secondary | ICD-10-CM

## 2020-04-25 NOTE — Patient Instructions (Signed)

## 2020-04-25 NOTE — Progress Notes (Signed)
30 y.o. G19P1011 Married White or Caucasian Not Hispanic or Latino female here for annual exam. patient would like to discuss contraception.  She had nausea with OCP's. Still nursing.  She has a 19 month old baby boy.  Period Cycle (Days): 28 Period Duration (Days): 4 Period Pattern: Regular Menstrual Flow: Moderate Menstrual Control: Maxi pad Menstrual Control Change Freq (Hours): 4-5 Dysmenorrhea: (!) Mild Dysmenorrhea Symptoms: Cramping,Nausea (back pain)   She was very anemic after the birth of her child. She is tired a lot, still getting up during the night with her baby. Eating ice.  Patient's last menstrual period was 04/19/2020.          Sexually active: Yes.   Rhythm for contraception.  The current method of family planning is none.    Exercising: No.  The patient does not participate in regular exercise at present. Smoker:  no  Health Maintenance: Pap:  07/05/16 Neg:Neg HR HPV History of abnormal Pap:  no TDaP:  07/22/17 Gardasil: never   reports that she has never smoked. She has never used smokeless tobacco. She reports that she does not drink alcohol and does not use drugs. 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   Past Medical History:  Diagnosis Date  . Anxiety   . Headache   . History of chicken pox 1996  . Infection    UTI  . Medical history non-contributory     Past Surgical History:  Procedure Laterality Date  . CESAREAN SECTION N/A 11/25/2018   Procedure: CESAREAN SECTION;  Surgeon: Sherlyn Hay, DO;  Location: [facility];  Service: Obstetrics;  Laterality: N/A;  . LAPAROSCOPY Left 08/20/2017   Procedure: LAPAROSCOPY OPERATIVE with treatment of left ectopic pregnancy;  Surgeon: Salvadore Dom, MD;  Location: [facility];  Service: Gynecology;  Laterality: Left;  and removal of left paratubal cyst   . NO PAST SURGERIES    . SALPINGECTOMY    . WISDOM TOOTH EXTRACTION      Current Outpatient Medications  Medication Sig Dispense Refill  . Prenatal  Vit-Fe Fumarate-FA (MULTIVITAMIN-PRENATAL) 27-0.8 MG TABS tablet Take 1 tablet by mouth daily at 12 noon.     No current facility-administered medications for this visit.    Family History  Problem Relation Age of Onset  . Depression Other   . Osteoporosis Other   . Breast cancer Other        paternal great aunt - breast cancer  . Heart disease Other   . Depression Paternal Aunt   . Cancer Paternal Aunt 83       paternal aunt - uterine cancer  . Cancer Maternal Aunt 17       maternal aunt - ovarian cancer  . Osteoporosis Mother   . Heart disease Maternal Grandfather     Review of Systems  Constitutional: Negative.   HENT: Negative.   Eyes: Negative.   Respiratory: Negative.   Cardiovascular: Negative.   Gastrointestinal: Negative.   Endocrine: Negative.   Genitourinary: Negative.   Musculoskeletal: Negative.   Skin: Negative.   Allergic/Immunologic: Negative.   Neurological: Negative.   Hematological: Negative.   Psychiatric/Behavioral: Negative.     Exam:   BP 128/80 (BP Location: Left Arm, Patient Position: Sitting, Cuff Size: Normal)   Pulse (!) 124   Ht 5' 2.25" (1.581 m)   Wt 103 lb (46.7 kg)   LMP 04/19/2020   BMI 18.69 kg/m   Weight change: @WEIGHTCHANGE @ Height:   Height: 5' 2.25" (158.1 cm)  Ht Readings from Last 3 Encounters:  04/25/20 5' 2.25" (1.581 m)  11/25/18 5\' 2"  (1.575 m)  08/20/17 5' 2.5" (1.588 m)    General appearance: alert, cooperative and appears stated age Head: Normocephalic, without obvious abnormality, atraumatic Neck: no adenopathy, supple, symmetrical, trachea midline and thyroid normal to inspection and palpation Lungs: clear to auscultation bilaterally Cardiovascular: regular rate and rhythm Breasts: normal appearance, no masses or tenderness Abdomen: soft, non-tender; non distended,  no masses,  no organomegaly Extremities: extremities normal, atraumatic, no cyanosis or edema Skin: Skin color, texture, turgor normal. No  rashes or lesions Lymph nodes: Cervical, supraclavicular, and axillary nodes normal. No abnormal inguinal nodes palpated Neurologic: Grossly normal   Pelvic: External genitalia:  no lesions              Urethra:  normal appearing urethra with no masses, tenderness or lesions              Bartholins and Skenes: normal                 Vagina: normal appearing vagina with normal color and discharge, no lesions              Cervix: no lesions               Bimanual Exam:  Uterus:  normal size, contour, position, consistency, mobility, non-tender and anteverted              Adnexa: no mass, fullness, tenderness               Rectovaginal: Confirms               Anus:  normal sphincter tone, no lesions  Terence Lux chaperoned for the exam.  1. Well woman exam Discussed breast self exam Discussed calcium and vit D intake   2. Screening for cervical cancer  - Cytology - PAP with HPV  3. Laboratory exam ordered as part of routine general medical examination  - CBC - Comprehensive metabolic panel - Lipid panel  4. Vitamin D deficiency  - VITAMIN D 25 Hydroxy (Vit-D Deficiency, Fractures)  5. Other fatigue -CBC - TSH  6. History of anemia  - CBC - Ferritin  7. General counseling and advice on female contraception Mirena IUD, will call with her cycle to schedule (if insertion after her cycle she will abstain) - IUD Insertion; Future

## 2020-04-26 LAB — CBC
HCT: 42.3 % (ref 35.0–45.0)
Hemoglobin: 14.2 g/dL (ref 11.7–15.5)
MCH: 30.2 pg (ref 27.0–33.0)
MCHC: 33.6 g/dL (ref 32.0–36.0)
MCV: 90 fL (ref 80.0–100.0)
MPV: 10.3 fL (ref 7.5–12.5)
Platelets: 366 10*3/uL (ref 140–400)
RBC: 4.7 10*6/uL (ref 3.80–5.10)
RDW: 11.7 % (ref 11.0–15.0)
WBC: 7 10*3/uL (ref 3.8–10.8)

## 2020-04-26 LAB — TSH: TSH: 3.2 mIU/L

## 2020-04-26 LAB — COMPREHENSIVE METABOLIC PANEL
AG Ratio: 1.8 (calc) (ref 1.0–2.5)
ALT: 17 U/L (ref 6–29)
AST: 18 U/L (ref 10–30)
Albumin: 4.8 g/dL (ref 3.6–5.1)
Alkaline phosphatase (APISO): 52 U/L (ref 31–125)
BUN: 12 mg/dL (ref 7–25)
CO2: 27 mmol/L (ref 20–32)
Calcium: 9.5 mg/dL (ref 8.6–10.2)
Chloride: 106 mmol/L (ref 98–110)
Creat: 0.67 mg/dL (ref 0.50–1.10)
Globulin: 2.6 g/dL (calc) (ref 1.9–3.7)
Glucose, Bld: 94 mg/dL (ref 65–99)
Potassium: 3.9 mmol/L (ref 3.5–5.3)
Sodium: 142 mmol/L (ref 135–146)
Total Bilirubin: 0.3 mg/dL (ref 0.2–1.2)
Total Protein: 7.4 g/dL (ref 6.1–8.1)

## 2020-04-26 LAB — LIPID PANEL
Cholesterol: 143 mg/dL (ref <200)
HDL: 53 mg/dL (ref >=50)
LDL Cholesterol (Calc): 71 mg/dL (calc)
Non-HDL Cholesterol (Calc): 90 mg/dL (calc) (ref <130)
Total CHOL/HDL Ratio: 2.7 (calc) (ref <5.0)
Triglycerides: 105 mg/dL (ref <150)

## 2020-04-26 LAB — FERRITIN: Ferritin: 21 ng/mL (ref 16–154)

## 2020-04-26 LAB — VITAMIN D 25 HYDROXY (VIT D DEFICIENCY, FRACTURES): Vit D, 25-Hydroxy: 30 ng/mL (ref 30–100)

## 2020-04-27 LAB — CYTOLOGY - PAP
Comment: NEGATIVE
Diagnosis: NEGATIVE
High risk HPV: NEGATIVE

## 2020-05-16 ENCOUNTER — Ambulatory Visit: Payer: 59 | Admitting: Obstetrics and Gynecology

## 2020-05-16 NOTE — Progress Notes (Deleted)
GYNECOLOGY  VISIT   HPI: 30 y.o.   Married White or Caucasian Not Hispanic or Latino  female   [phone] with Patient's last menstrual period was 04/19/2020.   here for Mirena IUD insertion  GYNECOLOGIC HISTORY: Patient's last menstrual period was 04/19/2020. Contraception:rythm  Menopausal hormone therapy: none         OB History    Gravida  2   Para  1   Term  1   Preterm  0   AB  1   Living  1     SAB  0   IAB  0   Ectopic  1   Multiple  0   Live Births  1              Patient Active Problem List   Diagnosis Date Noted  . Postpartum care following cesarean delivery 11/26/2018  . Normal labor and delivery 11/25/2018  . Status post primary low transverse cesarean section 11/25/2018  . History of ectopic pregnancy 08/28/2017  . Rh negative status during pregnancy 08/13/2017  . Neck pain 07/15/2015  . Generalized anxiety disorder 07/08/2015  . Left breast mass 12/24/2013    Past Medical History:  Diagnosis Date  . Anxiety   . Headache   . History of chicken pox 1996  . Infection    UTI  . Medical history non-contributory     Past Surgical History:  Procedure Laterality Date  . CESAREAN SECTION N/A 11/25/2018   Procedure: CESAREAN SECTION;  Surgeon: Sherlyn Hay, DO;  Location: [facility];  Service: Obstetrics;  Laterality: N/A;  . LAPAROSCOPY Left 08/20/2017   Procedure: LAPAROSCOPY OPERATIVE with treatment of left ectopic pregnancy;  Surgeon: Salvadore Dom, MD;  Location: [facility];  Service: Gynecology;  Laterality: Left;  and removal of left paratubal cyst   . NO PAST SURGERIES    . SALPINGECTOMY    . WISDOM TOOTH EXTRACTION      Current Outpatient Medications  Medication Sig Dispense Refill  . Prenatal Vit-Fe Fumarate-FA (MULTIVITAMIN-PRENATAL) 27-0.8 MG TABS tablet Take 1 tablet by mouth daily at 12 noon.     No current facility-administered medications for this visit.     ALLERGIES: Patient has no known allergies.  Family  History  Problem Relation Age of Onset  . Depression Other   . Osteoporosis Other   . Breast cancer Other        paternal great aunt - breast cancer  . Heart disease Other   . Depression Paternal Aunt   . Cancer Paternal Aunt 57       paternal aunt - uterine cancer  . Cancer Maternal Aunt 69       maternal aunt - ovarian cancer  . Osteoporosis Mother   . Heart disease Maternal Grandfather     Social History   Socioeconomic History  . Marital status: Married    Spouse name: Not on file  . Number of children: 0  . Years of education: Not on file  . Highest education level: Not on file  Occupational History  . Occupation: Ship broker    Comment: elementary education  Tobacco Use  . Smoking status: Never Smoker  . Smokeless tobacco: Never Used  Vaping Use  . Vaping Use: Never used  Substance and Sexual Activity  . Alcohol use: No    Alcohol/week: 0.0 standard drinks  . Drug use: No  . Sexual activity: Yes    Partners: Male    Birth control/protection: None  Other Topics Concern  . Not on file  Social History Narrative  . Not on file   Social Determinants of Health   Financial Resource Strain: Not on file  Food Insecurity: Not on file  Transportation Needs: Not on file  Physical Activity: Not on file  Stress: Not on file  Social Connections: Not on file  Intimate Partner Violence: Not on file    ROS  PHYSICAL EXAMINATION:    LMP 04/19/2020     General appearance: alert, cooperative and appears stated age Neck: no adenopathy, supple, symmetrical, trachea midline and thyroid {CHL AMB PHY EX THYROID NORM [phone]::"normal to inspection and palpation"} Breasts: {Exam; breast:13139::"normal appearance, no masses or tenderness"} Abdomen: soft, non-tender; non distended, no masses,  no organomegaly  Pelvic: External genitalia:  no lesions              Urethra:  normal appearing urethra with no masses, tenderness or lesions              Bartholins and  Skenes: normal                 Vagina: normal appearing vagina with normal color and discharge, no lesions              Cervix: {CHL AMB PHY EX CERVIX NORM DEFAULT:[id number]::"no lesions"}              Bimanual Exam:  Uterus:  {CHL AMB PHY EX UTERUS NORM DEFAULT:[phone]::"normal size, contour, position, consistency, mobility, non-tender"}              Adnexa: {CHL AMB PHY EX ADNEXA NO MASS DEFAULT:[phone]::"no mass, fullness, tenderness"}              Rectovaginal: {yes no:[id number]}.  Confirms.              Anus:  normal sphincter tone, no lesions  Chaperone was present for exam.  ASSESSMENT     PLAN    An After Visit Summary was printed and given to the patient.  *** minutes face to face time of which over 50% was spent in counseling.

## 2020-05-18 ENCOUNTER — Encounter: Payer: Self-pay | Admitting: Obstetrics and Gynecology

## 2020-05-18 ENCOUNTER — Ambulatory Visit (INDEPENDENT_AMBULATORY_CARE_PROVIDER_SITE_OTHER): Payer: 59 | Admitting: Obstetrics and Gynecology

## 2020-05-18 ENCOUNTER — Other Ambulatory Visit: Payer: Self-pay

## 2020-05-18 VITALS — Ht 62.5 in | Wt 105.0 lb

## 2020-05-18 DIAGNOSIS — Z3009 Encounter for other general counseling and advice on contraception: Secondary | ICD-10-CM | POA: Diagnosis not present

## 2020-05-18 DIAGNOSIS — Z01812 Encounter for preprocedural laboratory examination: Secondary | ICD-10-CM

## 2020-05-18 DIAGNOSIS — Z30430 Encounter for insertion of intrauterine contraceptive device: Secondary | ICD-10-CM | POA: Diagnosis not present

## 2020-05-18 LAB — PREGNANCY, URINE: Preg Test, Ur: NEGATIVE

## 2020-05-18 NOTE — Patient Instructions (Signed)

## 2020-05-18 NOTE — Progress Notes (Signed)
GYNECOLOGY  VISIT   HPI: 30 y.o.   Married White or Caucasian Not Hispanic or Latino  female   [phone] with Patient's last menstrual period was 04/19/2020.   here for IUD insertion.   LMP 05/15/20  GYNECOLOGIC HISTORY: Patient's last menstrual period was 05/15/2020. LMP 05/15/20.  Contraception:rhythem  Menopausal hormone therapy: none         OB History    Gravida  2   Para  1   Term  1   Preterm  0   AB  1   Living  1     SAB  0   IAB  0   Ectopic  1   Multiple  0   Live Births  1              Patient Active Problem List   Diagnosis Date Noted  . Postpartum care following cesarean delivery 11/26/2018  . Normal labor and delivery 11/25/2018  . Status post primary low transverse cesarean section 11/25/2018  . History of ectopic pregnancy 08/28/2017  . Rh negative status during pregnancy 08/13/2017  . Neck pain 07/15/2015  . Generalized anxiety disorder 07/08/2015  . Left breast mass 12/24/2013    Past Medical History:  Diagnosis Date  . Anxiety   . Headache   . History of chicken pox 1996  . Infection    UTI  . Medical history non-contributory     Past Surgical History:  Procedure Laterality Date  . CESAREAN SECTION N/A 11/25/2018   Procedure: CESAREAN SECTION;  Surgeon: Sherlyn Hay, DO;  Location: [facility];  Service: Obstetrics;  Laterality: N/A;  . LAPAROSCOPY Left 08/20/2017   Procedure: LAPAROSCOPY OPERATIVE with treatment of left ectopic pregnancy;  Surgeon: Salvadore Dom, MD;  Location: [facility];  Service: Gynecology;  Laterality: Left;  and removal of left paratubal cyst   . NO PAST SURGERIES    . SALPINGECTOMY    . WISDOM TOOTH EXTRACTION      Current Outpatient Medications  Medication Sig Dispense Refill  . Prenatal Vit-Fe Fumarate-FA (MULTIVITAMIN-PRENATAL) 27-0.8 MG TABS tablet Take 1 tablet by mouth daily at 12 noon.     No current facility-administered medications for this visit.     ALLERGIES: Patient has no  known allergies.  Family History  Problem Relation Age of Onset  . Depression Other   . Osteoporosis Other   . Breast cancer Other        paternal great aunt - breast cancer  . Heart disease Other   . Depression Paternal Aunt   . Cancer Paternal Aunt 24       paternal aunt - uterine cancer  . Cancer Maternal Aunt 71       maternal aunt - ovarian cancer  . Osteoporosis Mother   . Heart disease Maternal Grandfather     Social History   Socioeconomic History  . Marital status: Married    Spouse name: Not on file  . Number of children: 0  . Years of education: Not on file  . Highest education level: Not on file  Occupational History  . Occupation: Ship broker    Comment: elementary education  Tobacco Use  . Smoking status: Never Smoker  . Smokeless tobacco: Never Used  Vaping Use  . Vaping Use: Never used  Substance and Sexual Activity  . Alcohol use: No    Alcohol/week: 0.0 standard drinks  . Drug use: No  . Sexual activity: Yes    Partners: Male  Birth control/protection: None  Other Topics Concern  . Not on file  Social History Narrative  . Not on file   Social Determinants of Health   Financial Resource Strain: Not on file  Food Insecurity: Not on file  Transportation Needs: Not on file  Physical Activity: Not on file  Stress: Not on file  Social Connections: Not on file  Intimate Partner Violence: Not on file    Review of Systems  All other systems reviewed and are negative.   PHYSICAL EXAMINATION:    LMP 04/19/2020     General appearance: alert, cooperative and appears stated age   Pelvic: External genitalia:  no lesions              Urethra:  normal appearing urethra with no masses, tenderness or lesions              Bartholins and Skenes: normal                 Vagina: normal appearing vagina with normal color and discharge, no lesions              Cervix: no lesions  The risks of the mirena IUD were reviewed with the patient, including  infection, abnormal bleeding and uterine perfortion. Consent was signed.  A speculum was placed in the vagina, the cervix was cleansed with betadine. A tenaculum was placed on the cervix, the uterus sounded to 7 cm. The cervix was dilated to a 5 hagar dilator  The mirena IUD was inserted without difficulty. The string were cut to 3 cm.    The patient tolerated the procedure well.    Chaperone was present for exam.  1. General counseling and advice on female contraception Reviewed risks and side effects of the mirena IUD - IUD Insertion  2. Encounter for IUD insertion Mirena IUD inserted  3. Pre-procedure lab exam  - Pregnancy, urine: negative

## 2020-06-13 ENCOUNTER — Encounter: Payer: Self-pay | Admitting: Obstetrics and Gynecology

## 2020-06-21 ENCOUNTER — Ambulatory Visit (INDEPENDENT_AMBULATORY_CARE_PROVIDER_SITE_OTHER): Payer: 59 | Admitting: Obstetrics and Gynecology

## 2020-06-21 ENCOUNTER — Encounter: Payer: Self-pay | Admitting: Obstetrics and Gynecology

## 2020-06-21 ENCOUNTER — Other Ambulatory Visit: Payer: Self-pay

## 2020-06-21 VITALS — BP 100/62 | HR 75 | Ht 62.0 in | Wt 101.4 lb

## 2020-06-21 DIAGNOSIS — Z30431 Encounter for routine checking of intrauterine contraceptive device: Secondary | ICD-10-CM

## 2020-06-21 NOTE — Progress Notes (Signed)
GYNECOLOGY  VISIT   HPI: 30 y.o.   Married White or Caucasian Not Hispanic or Latino  female   [phone] with Patient's last menstrual period was 06/09/2020.   here for iud follow up. Patient states that she had been spotting for about two week. Occasional, mild cramping. She hasn't been sexually active since the IUD was inserted.  She had a mirena IUD inserted on 05/18/20.   GYNECOLOGIC HISTORY: Patient's last menstrual period was 06/09/2020. Contraception:iud  Menopausal hormone therapy: none        OB History    Gravida  2   Para  1   Term  1   Preterm  0   AB  1   Living  1     SAB  0   IAB  0   Ectopic  1   Multiple  0   Live Births  1              Patient Active Problem List   Diagnosis Date Noted  . Postpartum care following cesarean delivery 11/26/2018  . Normal labor and delivery 11/25/2018  . Status post primary low transverse cesarean section 11/25/2018  . History of ectopic pregnancy 08/28/2017  . Rh negative status during pregnancy 08/13/2017  . Neck pain 07/15/2015  . Generalized anxiety disorder 07/08/2015  . Left breast mass 12/24/2013    Past Medical History:  Diagnosis Date  . Anxiety   . Headache   . History of chicken pox 1996  . Infection    UTI  . Medical history non-contributory     Past Surgical History:  Procedure Laterality Date  . CESAREAN SECTION N/A 11/25/2018   Procedure: CESAREAN SECTION;  Surgeon: Sherlyn Hay, DO;  Location: [facility];  Service: Obstetrics;  Laterality: N/A;  . LAPAROSCOPY Left 08/20/2017   Procedure: LAPAROSCOPY OPERATIVE with treatment of left ectopic pregnancy;  Surgeon: Salvadore Dom, MD;  Location: [facility];  Service: Gynecology;  Laterality: Left;  and removal of left paratubal cyst   . NO PAST SURGERIES    . SALPINGECTOMY    . WISDOM TOOTH EXTRACTION      Current Outpatient Medications  Medication Sig Dispense Refill  . levonorgestrel (MIRENA) 20 MCG/24HR IUD 1 each by  Intrauterine route once.    Marland Kitchen VITAMIN D PO Take 1,000 Int'l Units/day by mouth.     No current facility-administered medications for this visit.     ALLERGIES: Patient has no known allergies.  Family History  Problem Relation Age of Onset  . Depression Other   . Osteoporosis Other   . Breast cancer Other        paternal great aunt - breast cancer  . Heart disease Other   . Depression Paternal Aunt   . Cancer Paternal Aunt 59       paternal aunt - uterine cancer  . Cancer Maternal Aunt 58       maternal aunt - ovarian cancer  . Osteoporosis Mother   . Heart disease Maternal Grandfather     Social History   Socioeconomic History  . Marital status: Married    Spouse name: Not on file  . Number of children: 0  . Years of education: Not on file  . Highest education level: Not on file  Occupational History  . Occupation: Ship broker    Comment: elementary education  Tobacco Use  . Smoking status: Never Smoker  . Smokeless tobacco: Never Used  Vaping Use  . Vaping Use:  Never used  Substance and Sexual Activity  . Alcohol use: No    Alcohol/week: 0.0 standard drinks  . Drug use: No  . Sexual activity: Yes    Partners: Male    Birth control/protection: None  Other Topics Concern  . Not on file  Social History Narrative  . Not on file   Social Determinants of Health   Financial Resource Strain: Not on file  Food Insecurity: Not on file  Transportation Needs: Not on file  Physical Activity: Not on file  Stress: Not on file  Social Connections: Not on file  Intimate Partner Violence: Not on file    Review of Systems  All other systems reviewed and are negative.   PHYSICAL EXAMINATION:    BP 100/62   Pulse 75   Ht 5\' 2"  (1.575 m)   Wt 101 lb 6.4 oz (46 kg)   LMP 06/09/2020   SpO2 100%   BMI 18.55 kg/m     General appearance: alert, cooperative and appears stated age  Pelvic: External genitalia:  no lesions              Urethra:  normal appearing urethra  with no masses, tenderness or lesions              Bartholins and Skenes: normal                 Vagina: normal appearing vagina with normal color and discharge, no lesions              Cervix: no lesions and IUD string 3 cm              Bimanual Exam:  Uterus:  normal size, contour, position, consistency, mobility, non-tender and anteverted              Adnexa: no mass, fullness, tenderness                1. IUD check up Doing well Routine f/u

## 2020-10-31 ENCOUNTER — Encounter: Payer: Self-pay | Admitting: Obstetrics and Gynecology

## 2021-07-11 ENCOUNTER — Ambulatory Visit: Payer: 59 | Admitting: Obstetrics and Gynecology
